# Patient Record
Sex: Female | Born: 1970 | Race: Black or African American | Hispanic: No | Marital: Single | Smoking: Current every day smoker
Health system: Southern US, Community
[De-identification: ages and names within clinical notes are randomized; demographics above are authoritative.]

## PROBLEM LIST (undated history)

## (undated) DIAGNOSIS — F329 Major depressive disorder, single episode, unspecified: Secondary | ICD-10-CM

## (undated) DIAGNOSIS — E119 Type 2 diabetes mellitus without complications: Secondary | ICD-10-CM

## (undated) DIAGNOSIS — F209 Schizophrenia, unspecified: Secondary | ICD-10-CM

## (undated) DIAGNOSIS — F32A Depression, unspecified: Secondary | ICD-10-CM

## (undated) DIAGNOSIS — F1910 Other psychoactive substance abuse, uncomplicated: Secondary | ICD-10-CM

## (undated) HISTORY — DX: Major depressive disorder, single episode, unspecified: F32.9

## (undated) HISTORY — PX: TUBAL LIGATION: SHX77

## (undated) HISTORY — DX: Depression, unspecified: F32.A

## (undated) HISTORY — DX: Other psychoactive substance abuse, uncomplicated: F19.10

## (undated) HISTORY — PX: OVARIAN CYST REMOVAL: SHX89

## (undated) HISTORY — PX: APPENDECTOMY: SHX54

---

## 2001-06-20 ENCOUNTER — Emergency Department (HOSPITAL_COMMUNITY): Admission: EM | Admit: 2001-06-20 | Discharge: 2001-06-20 | Payer: Self-pay | Admitting: Emergency Medicine

## 2001-10-01 ENCOUNTER — Emergency Department (HOSPITAL_COMMUNITY): Admission: EM | Admit: 2001-10-01 | Discharge: 2001-10-01 | Payer: Self-pay | Admitting: *Deleted

## 2001-10-01 ENCOUNTER — Encounter: Payer: Self-pay | Admitting: *Deleted

## 2004-10-02 ENCOUNTER — Emergency Department (HOSPITAL_COMMUNITY): Admission: EM | Admit: 2004-10-02 | Discharge: 2004-10-02 | Payer: Self-pay | Admitting: Emergency Medicine

## 2005-01-13 ENCOUNTER — Emergency Department (HOSPITAL_COMMUNITY): Admission: EM | Admit: 2005-01-13 | Discharge: 2005-01-13 | Payer: Self-pay | Admitting: Emergency Medicine

## 2007-06-17 ENCOUNTER — Emergency Department (HOSPITAL_COMMUNITY): Admission: EM | Admit: 2007-06-17 | Discharge: 2007-06-17 | Payer: Self-pay | Admitting: Emergency Medicine

## 2007-10-24 ENCOUNTER — Inpatient Hospital Stay (HOSPITAL_COMMUNITY): Admission: EM | Admit: 2007-10-24 | Discharge: 2007-10-27 | Payer: Self-pay | Admitting: Emergency Medicine

## 2007-10-24 ENCOUNTER — Encounter (INDEPENDENT_AMBULATORY_CARE_PROVIDER_SITE_OTHER): Payer: Self-pay | Admitting: General Surgery

## 2008-09-01 ENCOUNTER — Emergency Department (HOSPITAL_COMMUNITY): Admission: EM | Admit: 2008-09-01 | Discharge: 2008-09-01 | Payer: Self-pay | Admitting: Emergency Medicine

## 2008-09-25 ENCOUNTER — Emergency Department (HOSPITAL_COMMUNITY): Admission: EM | Admit: 2008-09-25 | Discharge: 2008-09-25 | Payer: Self-pay | Admitting: Emergency Medicine

## 2009-01-22 ENCOUNTER — Emergency Department (HOSPITAL_COMMUNITY): Admission: EM | Admit: 2009-01-22 | Discharge: 2009-01-22 | Payer: Self-pay | Admitting: Emergency Medicine

## 2009-01-24 ENCOUNTER — Emergency Department (HOSPITAL_COMMUNITY): Admission: EM | Admit: 2009-01-24 | Discharge: 2009-01-24 | Payer: Self-pay | Admitting: Emergency Medicine

## 2009-01-25 ENCOUNTER — Emergency Department (HOSPITAL_COMMUNITY): Admission: EM | Admit: 2009-01-25 | Discharge: 2009-01-25 | Payer: Self-pay | Admitting: Emergency Medicine

## 2009-04-15 ENCOUNTER — Inpatient Hospital Stay (HOSPITAL_COMMUNITY)
Admission: EM | Admit: 2009-04-15 | Discharge: 2009-04-17 | Payer: Self-pay | Source: Home / Self Care | Admitting: Emergency Medicine

## 2009-05-05 DIAGNOSIS — F1010 Alcohol abuse, uncomplicated: Secondary | ICD-10-CM | POA: Insufficient documentation

## 2009-05-05 DIAGNOSIS — F1910 Other psychoactive substance abuse, uncomplicated: Secondary | ICD-10-CM

## 2009-05-05 DIAGNOSIS — F329 Major depressive disorder, single episode, unspecified: Secondary | ICD-10-CM

## 2009-05-13 ENCOUNTER — Ambulatory Visit: Payer: Self-pay | Admitting: Cardiology

## 2009-05-13 DIAGNOSIS — I495 Sick sinus syndrome: Secondary | ICD-10-CM | POA: Insufficient documentation

## 2009-05-13 DIAGNOSIS — R079 Chest pain, unspecified: Secondary | ICD-10-CM

## 2009-05-13 DIAGNOSIS — R0602 Shortness of breath: Secondary | ICD-10-CM

## 2009-05-14 ENCOUNTER — Encounter: Payer: Self-pay | Admitting: Cardiology

## 2009-05-26 ENCOUNTER — Ambulatory Visit (HOSPITAL_COMMUNITY): Admission: RE | Admit: 2009-05-26 | Discharge: 2009-05-26 | Payer: Self-pay | Admitting: Cardiology

## 2009-05-26 ENCOUNTER — Ambulatory Visit: Payer: Self-pay | Admitting: Cardiology

## 2009-05-26 ENCOUNTER — Encounter: Payer: Self-pay | Admitting: Cardiology

## 2009-05-27 ENCOUNTER — Encounter: Payer: Self-pay | Admitting: Cardiology

## 2009-06-12 ENCOUNTER — Encounter (INDEPENDENT_AMBULATORY_CARE_PROVIDER_SITE_OTHER): Payer: Self-pay | Admitting: *Deleted

## 2009-07-24 ENCOUNTER — Encounter (INDEPENDENT_AMBULATORY_CARE_PROVIDER_SITE_OTHER): Payer: Self-pay | Admitting: *Deleted

## 2009-09-30 ENCOUNTER — Other Ambulatory Visit: Payer: Self-pay | Admitting: Emergency Medicine

## 2009-10-01 ENCOUNTER — Ambulatory Visit: Payer: Self-pay | Admitting: Psychiatry

## 2009-10-01 ENCOUNTER — Inpatient Hospital Stay (HOSPITAL_COMMUNITY): Admission: RE | Admit: 2009-10-01 | Discharge: 2009-10-03 | Payer: Self-pay | Admitting: Psychiatry

## 2010-01-02 ENCOUNTER — Encounter
Admission: RE | Admit: 2010-01-02 | Discharge: 2010-01-02 | Payer: Self-pay | Source: Home / Self Care | Attending: Internal Medicine | Admitting: Internal Medicine

## 2010-01-21 IMAGING — CR DG KNEE COMPLETE 4+V*R*
4 series · 4 of 4 positions shown · non-contrast
Comparison: None

CLINICAL DATA: Fell - right knee pain

RIGHT KNEE - COMPLETE 4+ VIEW

[view not recorded (1 of 4)]
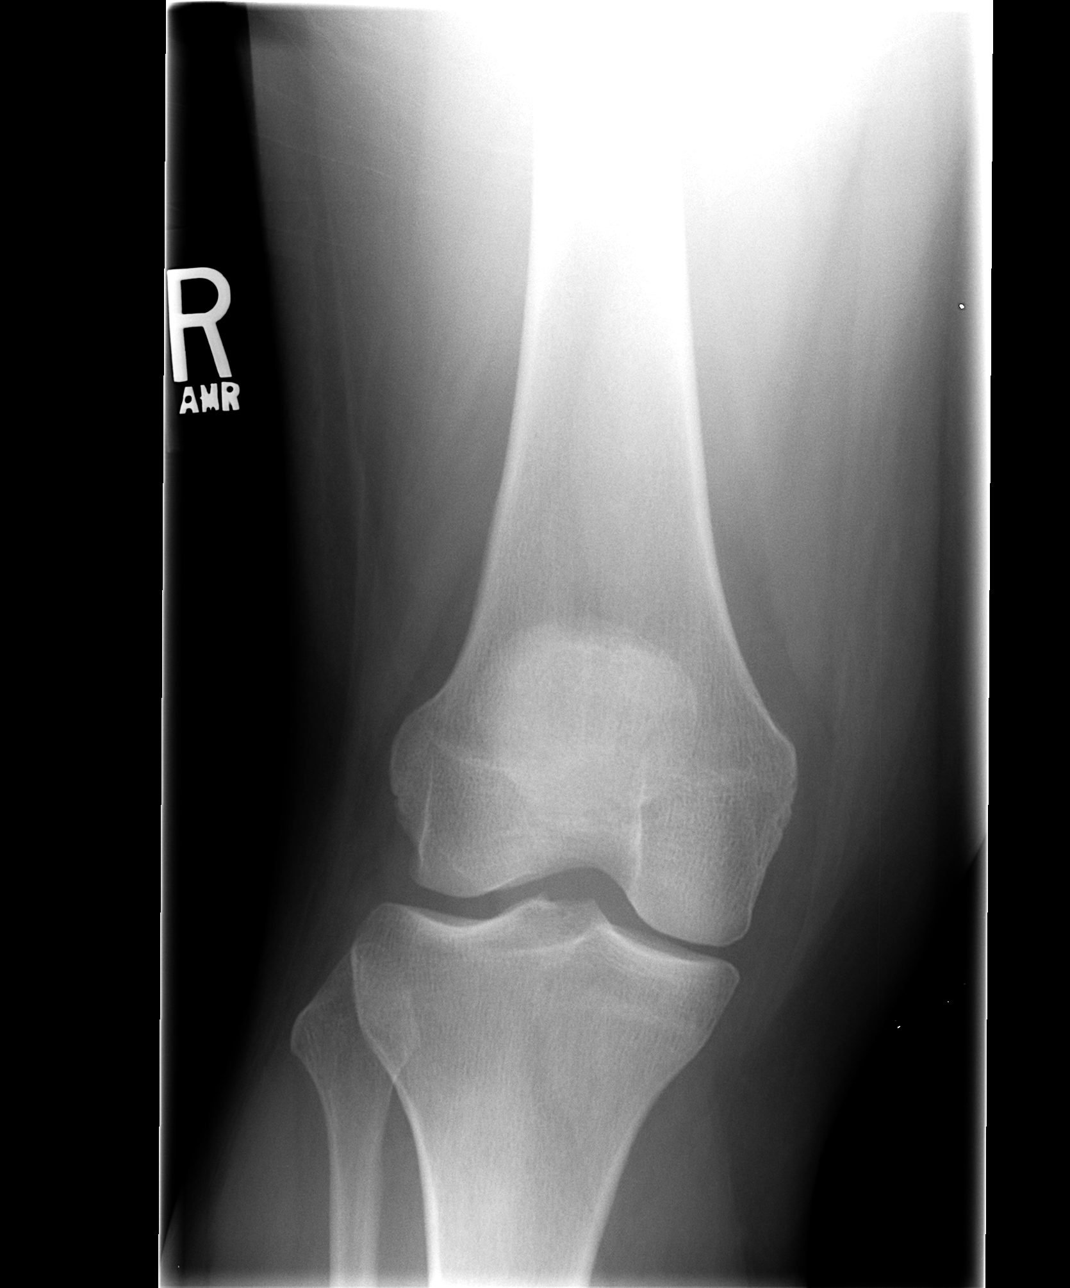

[view not recorded (2 of 4)]
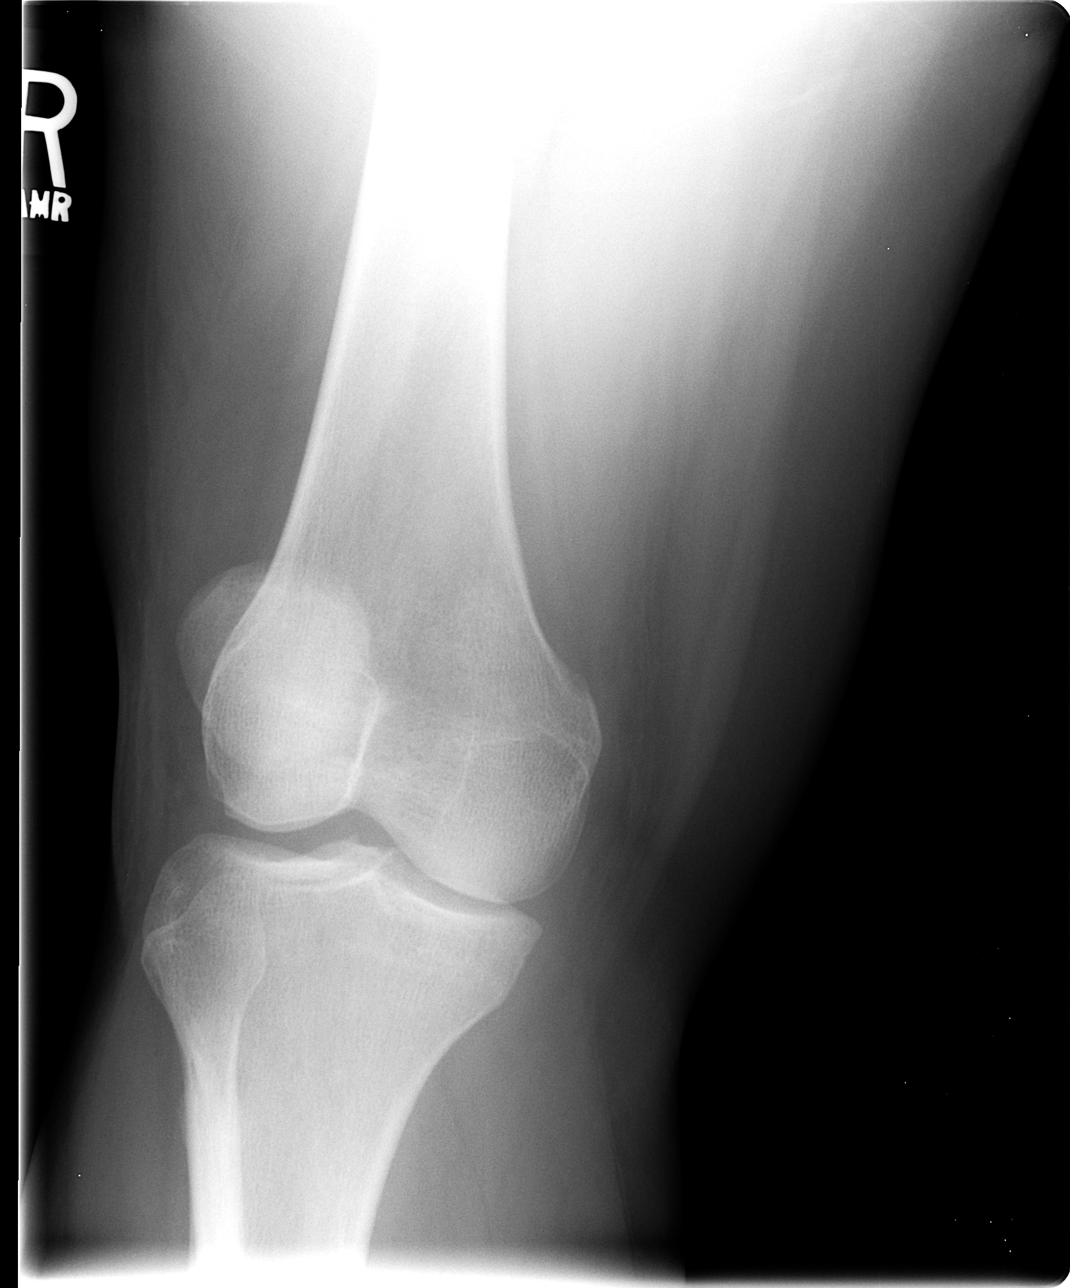

[view not recorded (3 of 4)]
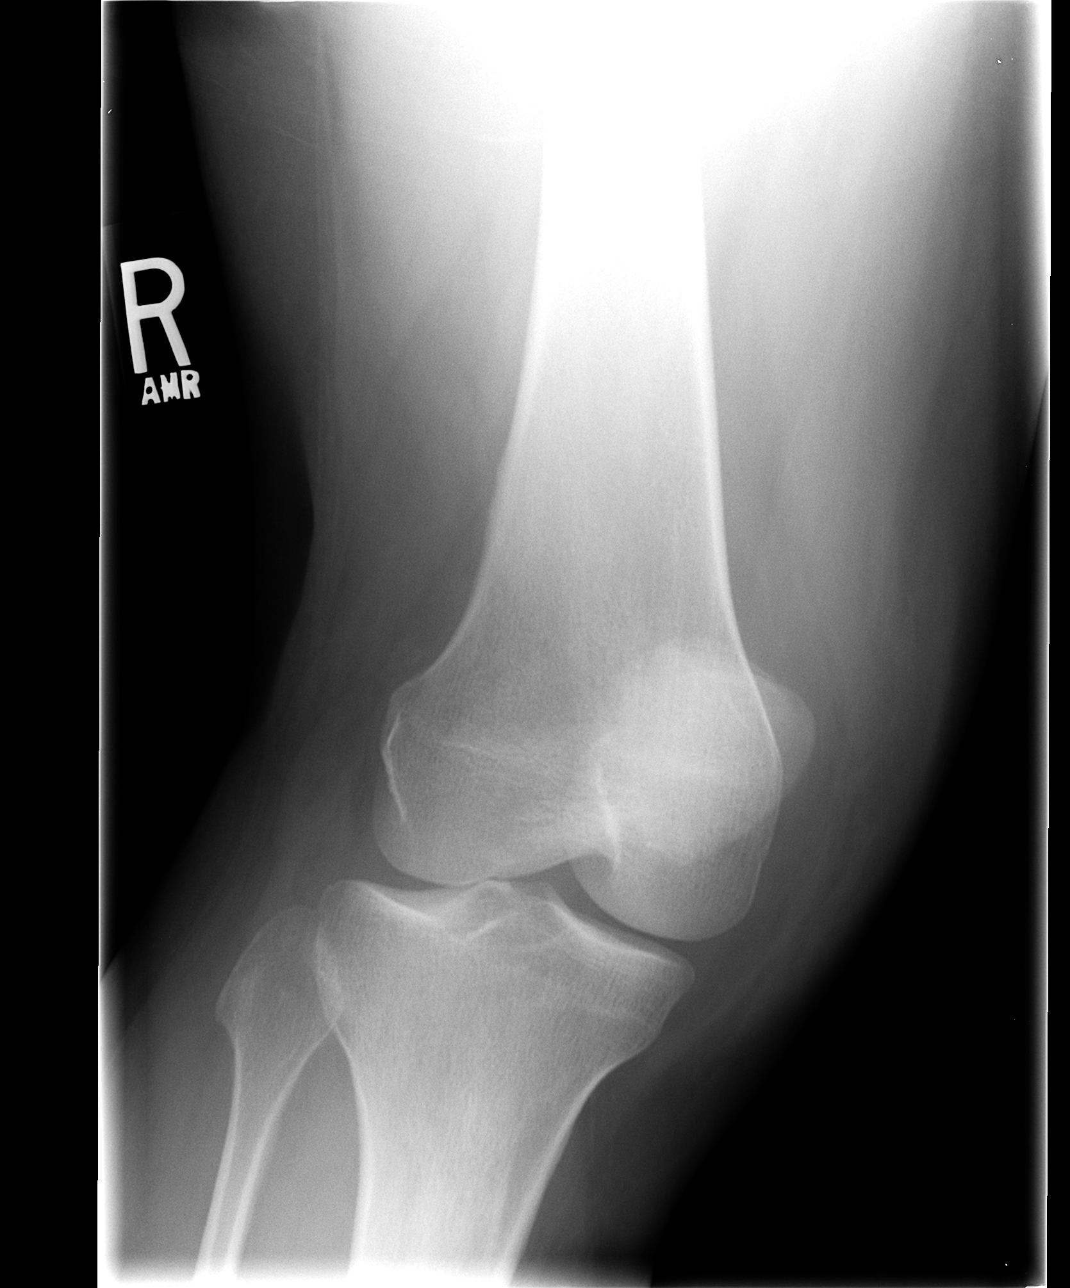

[view not recorded (4 of 4)]
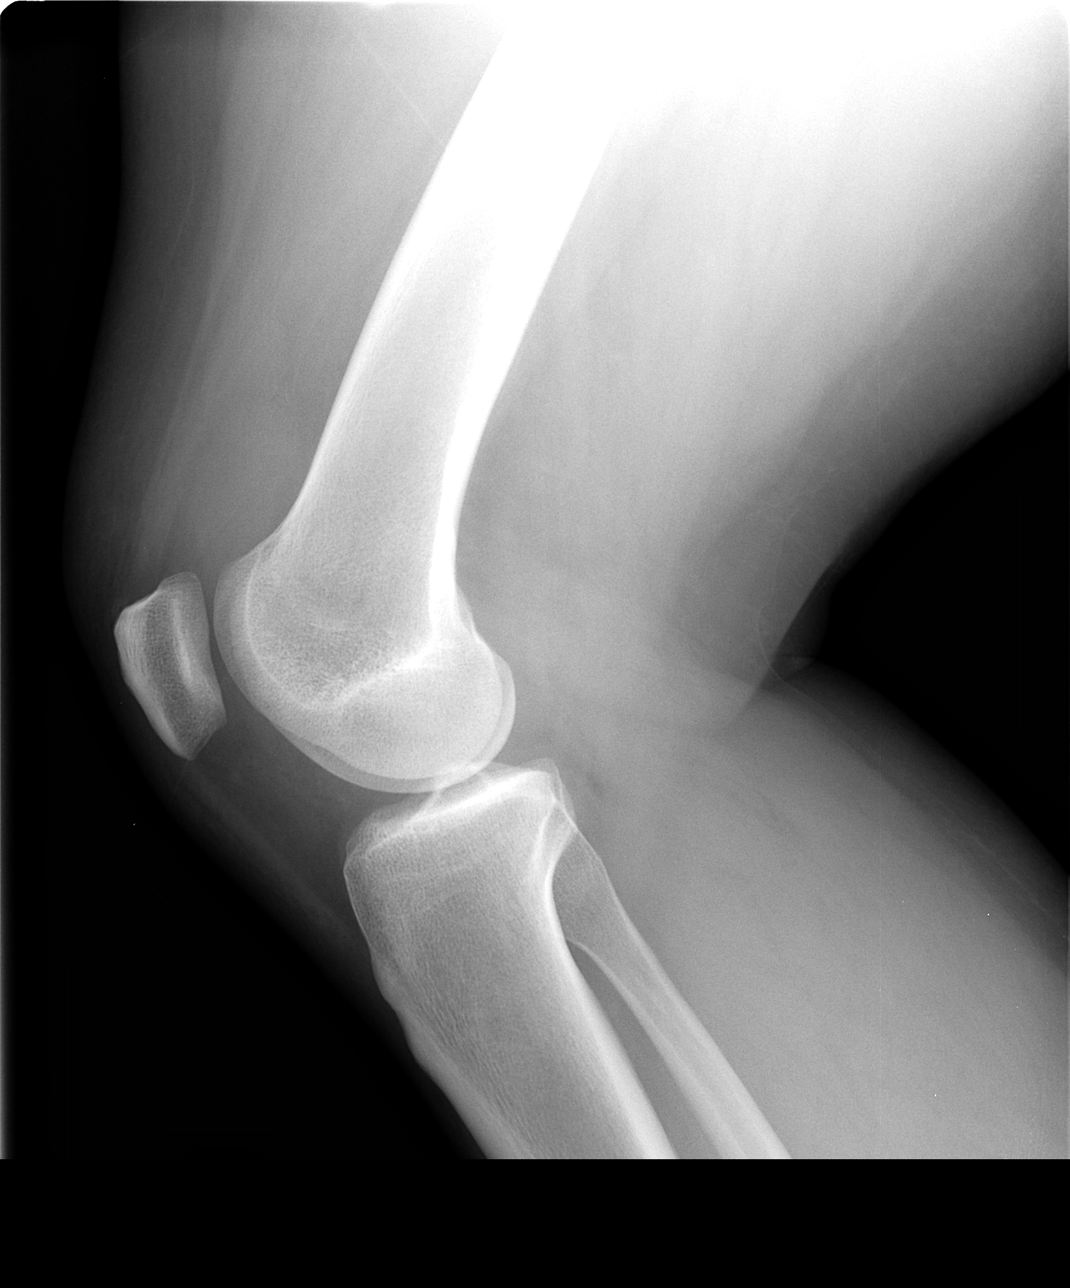

[4 of 4 positions shown; findings below may reference images not displayed]

FINDINGS: No fracture or dislocation.  Cannot rule out small joint
effusion.
IMPRESSION: Possible joint effusion but otherwise no acute findings.

## 2010-02-17 NOTE — Letter (Signed)
Summary:  Results Engineer, agricultural at [facility]  [address] [place] [address]   Phone: [phone]  Fax: [phone]      May 27, 2009 MRN: [id number]   [facility] [address] [place], [place]  [address]   Dear Ms. Freiberger,  Your test ordered by Selena Batten has been reviewed by your physician (or physician assistant) and was found to be normal or stable. Your physician (or physician assistant) felt no changes were needed at this time.  __X__ Echocardiogram  ____ Cardiac Stress Test  ____ Lab Work  ____ Peripheral vascular study of arms, legs or neck  ____ CT scan or X-ray  ____ Lung or Breathing test  ____ Other: Please continue on current medical treatment.   Thank you.   Nona Dell, MD, F.A.C.C

## 2010-02-17 NOTE — Assessment & Plan Note (Signed)
Summary: ***np6 bradycardia dyspnia   Visit Type:  Initial Consult Primary Provider:  [facility]   History of Present Illness: 40 year old woman referred for cardiology consultation. She is referred with documentation of recent sinus bradycardia and also symptoms of shortness of breath as well as chest discomfort.  She was recently admitted to [facility] in the setting of polysubstance abuse including acute alcohol intoxication and urine drug screen positive for cocaine. Based on the discharge summary, her behavior was quite erratic and disruptive. She apparently complained of some mild dyspnea during that hospitalization. Vital sign review finds hemodynamic stability including heart rates documented in the 60s to 80s. TSH was normal at 1.3. Chest x-ray is reviewed below.  She indicates at least a one-year history of dyspnea on exertion, NYHA class 2-3, and also intermittent chest discomfort as well as "tingling" the left arm and left leg with activity. She sometimes feels like her left shoulder "swells" when she walks. She also indicates intermittent palpitations and what sounds like potential orthopnea at times. She has only mild lower extremity edema intermittently. She denies any syncope.  I discussed with her quite frankly her history of polysubstance abuse, and she admitted to all of these today. She states that she is beginning a process of trying to quit. She states that her oldest daughter will be graduating from high school this summer, and has apparently explained to her mother that she does not "want her around" unless she is able to "get herself straight."  Current Medications (verified): 1)  None  Allergies (verified): No Known Drug Allergies  Past History:  Past Medical History: Last updated: 05/05/2009 Depression Polysubstance abuse resulting in hospitalization  Past Surgical History: Last updated: 05/05/2009 Appendectomy Tubal  ligation Ovarian cystectomy  Family History: Last updated: 05/05/2009 Noncontributory based on little information  Social History: Last updated: 05/05/2009 Single  Drug Use - yes (cocaine) Alcohol Use - yes  Clinical Review Panels:  CXR CXR results  Clinical Data: Overdose    PORTABLE CHEST - 1 VIEW    Comparison: 09/01/2008    Findings: Artifact overlies the chest.  There is abnormal density   in both lower lobes consistent with atelectasis and pneumonia,   possibly due to aspiration.  The upper lobes are clear.  No   effusions.    IMPRESSION:   Abnormal density in both lower lobes consistent with atelectasis   and pneumonia, possibly due to aspiration.    Read By:  Thomasenia Sales,  M.D.   Released By:  Thomasenia Sales,  M.D.  Additional Information  HL7 RESULT STATUS : F  External image : [id number]  External IF Update Timestamp : 2009-04-15:16:31:33.000000 (04/15/2009)    Review of Systems       The patient complains of chest pain and dyspnea on exertion.  The patient denies weight loss, syncope, prolonged cough, hemoptysis, abdominal pain, melena, and hematochezia.         Otherwise reviewed and negative.  Vital Signs:  Patient profile:   40 year old female Height:      61 inches Weight:      179 pounds BMI:     33.94 Pulse rate:   101 / minute BP sitting:   118 / 77  (right arm)  Vitals Entered By: Dreama Saa, CNA (May 13, 2009 3:44 PM)  Physical Exam  Additional Exam:  Somewhat disheveled young woman, overweight, in no acute distress. HEENT: Conjunctiva and lids normal, oropharynx with poor dentition.  Neck: Supple, no elevated JVP or bruits. Lungs: Clear with diminished, coarse breath sounds. Cardiac: Indistinct PMI, possible soft S3, no loud systolic murmur. Abdomen: Soft, nontender, bowel sounds present. Extremities: Trace ankle edema, distal pulses 1-2+. Skin: Warm and dry. Musculoskeletal: No kyphosis. Neuropsychiatric: Alert  and oriented x3.   EKG  Procedure date:  05/13/2009  Findings:      Sinus rhythm at 98 beats per minute, poor R-wave progression, probable left atrial enlargement.  Impression & Recommendations:  Problem # 1:  DYSPNEA (ICD-786.05)  Long-standing, at least over the last year, and noted in the setting of polysubstance abuse including at least cocaine and alcohol. Cardiomyopathy is certainly to be considered, as is cardiovascular disease. I discussed these issues with her today. We plan a 2-D echocardiogram to assess cardiac structure and function. This may help with further recommendations regarding potential medical therapy. I do not plan further ischemic testing or invasive evaluation at this point. Quite frankly, none of these interventions are likely to make much of a difference unless she is able to completely abstain from substance abuse. I discussed this with her very clearly. We also gave her some numbers to local rehabilitation centers. She otherwise plans to follow up with the health department. We will schedule a visit to review her study results.  Orders: 2-D Echocardiogram (2D Echo)  Problem # 2:  SUBSTANCE ABUSE, MULTIPLE (ICD-305.90)  Includes cocaine and alcohol, possibly others. She does admit to this today. I am hopeful that she will be able to make some positive lifestyle changes.  Problem # 3:  SINUS BRADYCARDIA (ICD-427.81)  I see this documented only once. It is not clear that this represents any clinically significant problem as yet.  Patient Instructions: 1)  Your physician recommends that you schedule a follow-up appointment in: 1 month 2)  Your physician recommends that you continue on your current medications as directed. Please refer to the Current Medication list given to you today. 3)  Your physician has requested that you have an echocardiogram.  Echocardiography is a painless test that uses sound waves to create images of your heart. It provides your doctor  with information about the size and shape of your heart and how well your heart's chambers and valves are working.  This procedure takes approximately one hour. There are no restrictions for this procedure.

## 2010-02-17 NOTE — Letter (Signed)
Summary: Appointment - Missed  [facility]  [address] [place] [address]   Phone: [phone]  Fax: [phone]     July 24, 2009 MRN: [id number]   [facility] [address] [place], [place]  [address]   Dear Wendy Larsen,  Our records indicate you missed your appointment on        07/24/09                with Dr.       .         MCDOWELL                              It is very important that we reach you to reschedule this appointment. We look forward to participating in your health care needs. Please contact us at the number listed above at your earliest convenience to reschedule this appointment.     Sincerely,    Neurosurgeon Team  Appended Document: Appointment - Missed letter was sent back to [place] 08/08/09/tmj

## 2010-02-17 NOTE — Letter (Signed)
Summary: EKG  EKG   Imported By: Faythe Ghee 05/14/2009 10:25:49  _____________________________________________________________________  External Attachment:    Type:   Image     Comment:   External Document

## 2010-02-17 NOTE — Letter (Signed)
Summary: PROGRESS NOTE PUBLIC HEALTH  PROGRESS NOTE PUBLIC HEALTH   Imported By: Faythe Ghee 05/14/2009 10:25:31  _____________________________________________________________________  External Attachment:    Type:   Image     Comment:   External Document

## 2010-02-17 NOTE — Letter (Signed)
Summary: Appointment - Missed  [facility]  [address] [place] [address]   Phone: [phone]  Fax: [phone]     Jun 12, 2009 MRN: [id number]   [facility] [address] [place], [place]  [address]   Dear Ms. Mairena,  Our records indicate you missed your appointment on        06/12/09                with Dr.       .    MCDOWELL                                It is very important that we reach you to reschedule this appointment. We look forward to participating in your health care needs. Please contact us at the number listed above at your earliest convenience to reschedule this appointment.     Sincerely,    Glass blower/designer

## 2010-03-02 ENCOUNTER — Telehealth (INDEPENDENT_AMBULATORY_CARE_PROVIDER_SITE_OTHER): Payer: Self-pay | Admitting: *Deleted

## 2010-03-11 ENCOUNTER — Telehealth (INDEPENDENT_AMBULATORY_CARE_PROVIDER_SITE_OTHER): Payer: Self-pay | Admitting: *Deleted

## 2010-03-11 NOTE — Progress Notes (Signed)
  Request received from [facility] sent to [facility] for completion [facility]  March 02, 2010 3:58 PM

## 2010-03-17 NOTE — Progress Notes (Signed)
  Phone Note Other Incoming   Request: Send information Summary of Call: Request received from [facility] sent to [employer].

## 2010-04-02 LAB — RAPID URINE DRUG SCREEN, HOSP PERFORMED
Barbiturates: NOT DETECTED
Cocaine: NOT DETECTED
Opiates: NOT DETECTED

## 2010-04-02 LAB — POCT I-STAT, CHEM 8
BUN: 6 mg/dL (ref 6–23)
Chloride: 105 mEq/L (ref 96–112)
HCT: 53 % — ABNORMAL HIGH (ref 36.0–46.0)
Hemoglobin: 18 g/dL — ABNORMAL HIGH (ref 12.0–15.0)
Potassium: 4.3 mEq/L (ref 3.5–5.1)
TCO2: 26 mmol/L (ref 0–100)

## 2010-04-05 LAB — WOUND CULTURE: Culture: NO GROWTH

## 2010-04-12 LAB — RAPID URINE DRUG SCREEN, HOSP PERFORMED
Amphetamines: NOT DETECTED
Barbiturates: NOT DETECTED
Cocaine: POSITIVE — AB
Opiates: NOT DETECTED

## 2010-04-12 LAB — COMPREHENSIVE METABOLIC PANEL
AST: 20 U/L (ref 0–37)
AST: 16 U/L (ref 0–37)
Albumin: 3.3 g/dL — ABNORMAL LOW (ref 3.5–5.2)
Alkaline Phosphatase: 44 U/L (ref 39–117)
BUN: 5 mg/dL — ABNORMAL LOW (ref 6–23)
BUN: 7 mg/dL (ref 6–23)
CO2: 19 mEq/L (ref 19–32)
CO2: 23 mEq/L (ref 19–32)
Calcium: 9.4 mg/dL (ref 8.4–10.5)
Chloride: 110 mEq/L (ref 96–112)
Chloride: 116 mEq/L — ABNORMAL HIGH (ref 96–112)
Creatinine, Ser: 0.68 mg/dL (ref 0.4–1.2)
Creatinine, Ser: 0.84 mg/dL (ref 0.4–1.2)
Creatinine, Ser: 0.88 mg/dL (ref 0.4–1.2)
GFR calc Af Amer: >60 mL/min (ref >60)
GFR calc Af Amer: >60 mL/min (ref >60)
GFR calc non Af Amer: >60 mL/min (ref >60)
GFR calc non Af Amer: >60 mL/min (ref >60)
Glucose, Bld: 111 mg/dL — ABNORMAL HIGH (ref 70–99)
Glucose, Bld: 80 mg/dL (ref 70–99)
Potassium: 3.9 mEq/L (ref 3.5–5.1)
Potassium: 4.2 mEq/L (ref 3.5–5.1)
Total Bilirubin: 0.7 mg/dL (ref 0.3–1.2)
Total Bilirubin: 0.7 mg/dL (ref 0.3–1.2)
Total Protein: 6.3 g/dL (ref 6.0–8.3)

## 2010-04-12 LAB — URINE CULTURE: Colony Count: NO GROWTH

## 2010-04-12 LAB — URINALYSIS, ROUTINE W REFLEX MICROSCOPIC
Bilirubin Urine: NEGATIVE
Glucose, UA: NEGATIVE mg/dL
Nitrite: NEGATIVE
Specific Gravity, Urine: 1.015 (ref 1.005–1.030)
pH: 6 (ref 5.0–8.0)

## 2010-04-12 LAB — BASIC METABOLIC PANEL
BUN: 5 mg/dL — ABNORMAL LOW (ref 6–23)
Calcium: 8.9 mg/dL (ref 8.4–10.5)
GFR calc non Af Amer: >60 mL/min (ref >60)
Potassium: 4.1 mEq/L (ref 3.5–5.1)

## 2010-04-12 LAB — BLOOD GAS, ARTERIAL
Acid-base deficit: 3 mmol/L — ABNORMAL HIGH (ref 0.0–2.0)
Bicarbonate: 21 mEq/L (ref 20.0–24.0)
O2 Saturation: 93 %
TCO2: 18.8 mmol/L (ref 0–100)
pH, Arterial: 7.381 (ref 7.350–7.400)

## 2010-04-12 LAB — CBC
HCT: 35.5 % — ABNORMAL LOW (ref 36.0–46.0)
HCT: 37.9 % (ref 36.0–46.0)
Hemoglobin: 12.4 g/dL (ref 12.0–15.0)
MCHC: 35.4 g/dL (ref 30.0–36.0)
MCV: 92.8 fL (ref 78.0–100.0)
MCV: 95.7 fL (ref 78.0–100.0)
MCV: 95.5 fL (ref 78.0–100.0)
Platelets: 199 10*3/uL (ref 150–400)
Platelets: 221 10*3/uL (ref 150–400)
RBC: 3.72 MIL/uL — ABNORMAL LOW (ref 3.87–5.11)
RBC: 4.41 MIL/uL (ref 3.87–5.11)
RDW: 14.9 % (ref 11.5–15.5)
WBC: 12.4 10*3/uL — ABNORMAL HIGH (ref 4.0–10.5)
WBC: 12.2 10*3/uL — ABNORMAL HIGH (ref 4.0–10.5)

## 2010-04-12 LAB — ACETAMINOPHEN LEVEL: Acetaminophen (Tylenol), Serum: <10 ug/mL — ABNORMAL LOW (ref 10–30)

## 2010-04-12 LAB — DIFFERENTIAL
Basophils Absolute: 0 10*3/uL (ref 0.0–0.1)
Basophils Relative: 0 % (ref 0–1)
Eosinophils Relative: 3 % (ref 0–5)
Lymphocytes Relative: 31 % (ref 12–46)
Lymphocytes Relative: 21 % (ref 12–46)
Lymphocytes Relative: 26 % (ref 12–46)
Lymphs Abs: 3.1 10*3/uL (ref 0.7–4.0)
Monocytes Absolute: 0.9 10*3/uL (ref 0.1–1.0)
Monocytes Relative: 7 % (ref 3–12)
Neutro Abs: 7.7 10*3/uL (ref 1.7–7.7)
Neutro Abs: 8.6 10*3/uL — ABNORMAL HIGH (ref 1.7–7.7)
Neutrophils Relative %: 60 % (ref 43–77)

## 2010-04-12 LAB — T4, FREE: Free T4: 1.19 ng/dL (ref 0.80–1.80)

## 2010-04-12 LAB — TSH: TSH: 1.376 u[IU]/mL (ref 0.350–4.500)

## 2010-04-12 LAB — SALICYLATE LEVEL: Salicylate Lvl: <4 mg/dL (ref 2.8–20.0)

## 2010-04-12 LAB — PREGNANCY, URINE: Preg Test, Ur: NEGATIVE

## 2010-04-12 LAB — MAGNESIUM: Magnesium: 2.1 mg/dL (ref 1.5–2.5)

## 2010-06-02 NOTE — Discharge Summary (Signed)
NAMESHANEA, Wendy Larsen                 ACCOUNT NO.:  [id number]   MEDICAL RECORD NO.:  [id number]          PATIENT TYPE:  INP   LOCATION:  A323                          FACILITY:  [facility]   PHYSICIAN:  Barbaraann Barthel, M.D. DATE OF BIRTH:  04/15/1970   DATE OF ADMISSION:  10/24/2007  DATE OF DISCHARGE:  10/09/2009LH                               DISCHARGE SUMMARY   DIAGNOSES:  1. Acute appendicitis.  2. Ruptured right ovarian cyst.   This is a 40 year old black female who came to the emergency room with  right lower quadrant pain with nausea and vomiting and an elevated white  count.  She was seen in the emergency room and evaluated where a CT scan  revealed changes suggestive of acute appendicitis.  She also had some  fluid in her pelvis.  We took her to surgery where an open appendectomy  was performed.  She had an obvious ruptured ovarian cyst with some  serosanguineous fluid in her pelvis as well as an acute supportive  nonperforated appendix.  Appendectomy was performed uneventfully.  The  drain was placed and she was discharged on the third postoperative day  at which time she was tolerating p.o. well.  Her wound was clean.  Her  Jackson-Pratt drain was serous in nature and she remained afebrile  without shortness of breath, leg pain, or any other signs of wound  infection.   LABORATORY DATA:  CT scan revealed changes suggestive of acute  appendicitis as mentioned above.  Her laboratory work, she was admitted  with an 18,000 white count on the following day, this was down to  16,000.  As stated, she remained clinically stable with an afebrile.  Her pathology report revealed acute supportive appendicitis.   DISCHARGE INSTRUCTIONS:  The patient is discharged on Cipro 500 mg p.o.  b.i.d.  We will give her a dose a couple of hours early prior to her  discharge, as she may have some trouble obtaining her Cipro as an  outpatient.  She is instructed to take her Cipro around 10 or 11  o'clock  tonight and 10 or 11 o'clock in the morning every 12 hours.  She is told  to continue to take her Tegretol, which she takes for her manic  depression.  She is told to avoid taking any aspirin products.   Her diet, she is told to take clear liquids and soft diet as tolerated.  She is told to take no harsh cathartics or laxatives.  She is told to  clean her wound with alcohol 2 or 3 times a day and she is told to  contact us should there be any acute changes.  We also told her to  refrain from taking any aspirin products.  We will follow up with her  and remove her sutures on the seventh postoperative day.  We discussed  followup care with her and all questions were answered.      Barbaraann Barthel, M.D.  Electronically Signed     WB/MEDQ  D:  10/27/2007  T:  10/27/2007  Job:  [id number]   cc:  Rhae Lerner. Margretta Ditty, M.D.  [address]  [place]  [place] [address]

## 2010-06-02 NOTE — Consult Note (Signed)
Wendy Larsen, Wendy Larsen                 ACCOUNT NO.:  [id number]   MEDICAL RECORD NO.:  [id number]          PATIENT TYPE:  INP   LOCATION:  A323                          FACILITY:  [facility]   PHYSICIAN:  Barbaraann Barthel, M.D. DATE OF BIRTH:  1970/11/02   DATE OF CONSULTATION:  10/24/2007  DATE OF DISCHARGE:                                 CONSULTATION   Note, Surgery was asked to see this 40 year old black female for  abdominal pain.   CHIEF COMPLAINT:  Abdominal pain with nausea and vomiting.   HISTORY OF PRESENT MEDICAL ILLNESS:  The patient states that she was in  her normal state of good health until around midnight last night when  she developed some right lower quadrant pain.  Her lower abdominal pain  that continued during the night was accompanied with nausea and  vomiting.  This did not get better when she took some antacids and then  later she came to the emergency room where she was examined.  The CT  scan revealed the presence of acute appendicitis.  I discussed this with  Dr. Allyson Sabal, the radiologist as well.   PHYSICAL EXAMINATION:  GENERAL:  She is uncomfortable, but in no acute  distress.  She is 5 feet 1 inch, and weighs at least 140 pounds.  VITAL SIGNS:  Blood pressure is 120/68, respirations are 24, pulse rate  73 and regular, and temperature upon admission is 97.9.  HEENT:  Head is normocephalic.  Eyes, extraocular movements are intact.  Pupils are round and react to light and accommodation.  There is no  conjunctival pallor or scleral injection.  The sclerae is in normal  tincture.  Nose and oral mucosa are moist.  The patient has very poor  dentition.  Neck is supple and cylindrical without jugular vein  distention, thyromegaly, tracheal deviation, cervical adenopathy, or  bruits auscultated.  CHEST:  Clear, both anterior and posterior auscultation.  HEART:  Regular rhythm.  BREASTS AND AXILLA:  Without masses.  ABDOMEN:  Bowel sounds are diminished.  The patient  has tenderness and  guarding in the right lower quadrant.  There are no femoral or inguinal  hernias appreciated.  RECTAL:  Guaiac-negative stool.  EXTREMITIES:  Within normal limits.   REVIEW OF SYSTEMS:  GI:  Nausea and vomiting and she says she has been  having some loose stools over the last couple of days, but no bright red  rectal bleeding, or unexplained weight loss, or any history of  inflammatory bowel disease, or irritable bowel syndrome.  She has had no  previous colonoscopy.  No history of hepatitis.  GU:  No history of  nephrolithiasis or dysuria.  CARDIORESPIRATORY:  She does smoke and she  has been trying to quit.  She is now down to approximately 3 cigarettes  per day.  ENDOCRINE:  No history of diabetes or thyroid disease.  NEUROLOGIC:  The patient has a history of anxiety and manic depression  for which she takes some psychiatric meds which she did not bring to the  emergency room.  MUSCULOSKELETAL:  The patient states that  she is 140  pounds, but I suspect she is probably 160.  OB/GYN:  She had a negative  serum hCG.  She is a gravida 3, para 2, abortus 1, and cesarean 0  female.  She had a tubal ligation in 1996 and she has a history of a  paternal aunt with history of breast cancer.   PAST SURGERY:  Tubal ligation in 1996, that is all the surgery that she  has had.   DRUG ALLERGIES:  She states that she has an intolerance to ASPIRIN that  gives her nausea.  She has no history of rash or respiratory problem  when taking this.   MEDICATIONS:  As stated, she is on some psychiatric meds which she did  not bring.   LABORATORY DATA:  The patient has a white count of 18.8 with an H and H  of 13.7 and 39.9, and platelet count 251,000.  She has a left shift with  87% neutrophils.  Metabolic 7 is grossly within normal limits and as  stated first, urine hCG is negative.  Electrolytes grossly within normal  limits.  Her BUN is 7 with a creatinine of 0.67.  Liver function  studies  were done and they are within normal limits.   IMPRESSION:  Acute appendicitis.   PLAN:  Continue to keep her n.p.o.  She has not eaten since the wee  hours in the morning.  She said it is approximately 3:00 a.m. this  morning she has not eaten.  We will continue her n.p.o. status.  We will  begin antibiotics and hydration and plan for an appendectomy.   We discussed the surgery in detail with the patient.  We discussed  complications not limited to, but including bleeding, infection, and  appendiceal stump leak.  Informed consent was obtained.  All questions  were answered.      Barbaraann Barthel, M.D.  Electronically Signed     WB/MEDQ  D:  10/24/2007  T:  10/25/2007  Job:  [id number]   cc:   Rhae Lerner. Margretta Ditty, M.D.  [address]  [place]  [place] [address]   Barbaraann Barthel, M.D.  Fax: [phone]

## 2010-06-02 NOTE — Op Note (Signed)
Wendy Larsen, Wendy Larsen                 ACCOUNT NO.:  [id number]   MEDICAL RECORD NO.:  [id number]          PATIENT TYPE:  INP   LOCATION:  A323                          FACILITY:  [facility]   PHYSICIAN:  Barbaraann Barthel, M.D. DATE OF BIRTH:  [birth year]   DATE OF PROCEDURE:  10/24/2007  DATE OF DISCHARGE:                               OPERATIVE REPORT   POSTOPERATIVE DIAGNOSIS:  Acute appendicitis.   PROCEDURE:  Open appendectomy.   SPECIMEN:  Appendix.   WOUND CLASSIFICATION:  Contaminated.   HISTORY:  Note, this is a 40 year old black female who came into the  emergency room with less than 24-hour history of right lower quadrant  pain, nausea, and vomiting.  CT scan was done in the emergency room  which revealed acute appendicitis.  It was also appeared to be a  ruptured cyst, the Corpus luteum cyst on CT scan as well with little  fluid and no obvious free air.   We discussed appendectomy with the patient discussing complications not  limited to, but including bleeding, infection, and appendiceal stump  leak.  We also discussed possibility that drain might be placed pending  upon the findings.  Informed consent was obtained.   GROSS OPERATIVE FINDINGS:  Consistent with acute suppurative  nonperforated appendix, but rather necrotic towards its base.  Ruptured  Corpus luteum cyst on the right ovary.  There was fluid and blood in the  right lower iliac fossa.   Terminal ileum also appeared to be normal.  No other abnormalities  found.   TECHNIQUE:  The patient was placed in supine position.  After the  adequate administration of general anesthesia via endotracheal  intubation, her entire abdomen was prepped with Betadine solution and  draped in usual manner.  A Foley catheter was aseptically inserted and  then an incision was carried out on the right lower quadrant through  skin and subcutaneous tissue down to the rectus muscle which was  retracted medially.  This was not divided.   The posterior sheath and the  peritoneum was grasped and opened, and the abdomen was entered and  explored with the above finding.  I placed a wound liner in the wound  and then ligated the mesoappendix with 2-0 silk and amputated it with TA-  30 stapling device.  The stump was oversewn with 3-0 GI silk and I  elected to leave a drain in the right lower quadrant.  After checking  for hemostasis, we changed gloves, irrigated with normal saline  solution, then I closed the peritoneum with a running 0 Polysorb suture  and the fascia with interrupted figure-of-eight 0 Polysorb sutures.  A  0.5% Sensorcaine was used for postoperative comfort and we closed the  wound with a stapling device.  The drain was sutured in place with 3-0  nylon.  Prior to closure all sponge, needle, and instrument counts were  found to be correct.  Estimated blood loss was minimal.  The patient  received 1100 mL crystalloids intraoperatively.  One Jackson-Pratt drain was placed as mentioned and we did take cultures  near the appendix.  Prior to closure as stated, all sponge, needle, and  instrument counts were found be correct.  The patient was taken to  recovery room in satisfactory condition.      Barbaraann Barthel, M.D.  Electronically Signed     WB/MEDQ  D:  10/24/2007  T:  10/25/2007  Job:  [id number]   cc:   Rhae Lerner. Margretta Ditty, M.D.  [address]  [place]  [place] [address]

## 2010-10-14 LAB — URINE MICROSCOPIC-ADD ON

## 2010-10-14 LAB — URINE CULTURE

## 2010-10-14 LAB — URINALYSIS, ROUTINE W REFLEX MICROSCOPIC
Glucose, UA: NEGATIVE
Specific Gravity, Urine: 1.01
pH: 6

## 2010-10-19 LAB — BASIC METABOLIC PANEL
CO2: 22
GFR calc non Af Amer: >60
Glucose, Bld: 132 — ABNORMAL HIGH
Potassium: 3.8
Sodium: 139

## 2010-10-19 LAB — URINALYSIS, ROUTINE W REFLEX MICROSCOPIC
Ketones, ur: 15 — AB
Nitrite: NEGATIVE
Protein, ur: NEGATIVE

## 2010-10-19 LAB — DIFFERENTIAL
Basophils Absolute: 0
Basophils Absolute: 0.1
Basophils Relative: 0
Eosinophils Relative: 1
Lymphocytes Relative: 14
Monocytes Absolute: 1.1 — ABNORMAL HIGH
Monocytes Relative: 7
Neutro Abs: 16.4 — ABNORMAL HIGH
Neutrophils Relative %: 87 — ABNORMAL HIGH

## 2010-10-19 LAB — CBC
HCT: 38.1
Hemoglobin: 12.9
Hemoglobin: 13.7
MCV: 95.8
RBC: 3.93
RBC: 4.16
RDW: 13.5
WBC: 18.8 — ABNORMAL HIGH

## 2010-10-19 LAB — COMPREHENSIVE METABOLIC PANEL
Alkaline Phosphatase: 44
BUN: 7
Chloride: 105
Glucose, Bld: 108 — ABNORMAL HIGH
Potassium: 4
Total Bilirubin: 0.4

## 2010-10-19 LAB — PREGNANCY, URINE: Preg Test, Ur: NEGATIVE

## 2010-10-19 LAB — WOUND CULTURE: Culture: NO GROWTH

## 2011-01-13 ENCOUNTER — Encounter: Payer: Self-pay | Admitting: Cardiology

## 2011-10-30 ENCOUNTER — Emergency Department (HOSPITAL_COMMUNITY)
Admission: EM | Admit: 2011-10-30 | Discharge: 2011-10-31 | Discharge status: Against Medical Advice | Payer: Medicaid Other | Attending: Emergency Medicine | Admitting: Emergency Medicine

## 2011-10-30 DIAGNOSIS — Z0389 Encounter for observation for other suspected diseases and conditions ruled out: Secondary | ICD-10-CM | POA: Insufficient documentation

## 2011-10-30 NOTE — ED Notes (Signed)
Pt refuses to cooperate during triage and asks to leave.
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
## 2011-11-06 ENCOUNTER — Encounter (HOSPITAL_COMMUNITY): Payer: Self-pay

## 2011-11-06 ENCOUNTER — Emergency Department (HOSPITAL_COMMUNITY)
Admission: EM | Admit: 2011-11-06 | Discharge: 2011-11-06 | Disposition: A | Discharge status: Home / Self Care | Payer: Medicaid Other | Attending: Emergency Medicine | Admitting: Emergency Medicine

## 2011-11-06 ENCOUNTER — Emergency Department (HOSPITAL_COMMUNITY): Payer: Medicaid Other

## 2011-11-06 DIAGNOSIS — F3289 Other specified depressive episodes: Secondary | ICD-10-CM | POA: Insufficient documentation

## 2011-11-06 DIAGNOSIS — F1910 Other psychoactive substance abuse, uncomplicated: Secondary | ICD-10-CM | POA: Insufficient documentation

## 2011-11-06 DIAGNOSIS — F329 Major depressive disorder, single episode, unspecified: Secondary | ICD-10-CM | POA: Insufficient documentation

## 2011-11-06 DIAGNOSIS — M25559 Pain in unspecified hip: Secondary | ICD-10-CM

## 2011-11-06 DIAGNOSIS — Z9181 History of falling: Secondary | ICD-10-CM | POA: Insufficient documentation

## 2011-11-06 MED ORDER — HYDROCODONE-ACETAMINOPHEN 5-325 MG PO TABS
1.0000 | ORAL_TABLET | Freq: Once | ORAL | Status: AC
  Administered 2011-11-06: 1 via ORAL
  Filled 2011-11-06: qty 1
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
## 2011-11-06 NOTE — ED Notes (Signed)
C/o rt hip post fall 1 week ago.

## 2011-11-18 ENCOUNTER — Encounter (HOSPITAL_COMMUNITY): Payer: Self-pay | Admitting: *Deleted

## 2011-11-18 ENCOUNTER — Emergency Department (HOSPITAL_COMMUNITY)
Admission: EM | Admit: 2011-11-18 | Discharge: 2011-11-18 | Disposition: A | Discharge status: Home / Self Care | Payer: Medicaid Other | Attending: Emergency Medicine | Admitting: Emergency Medicine

## 2011-11-18 DIAGNOSIS — M62838 Other muscle spasm: Secondary | ICD-10-CM | POA: Insufficient documentation

## 2011-11-18 DIAGNOSIS — R05 Cough: Secondary | ICD-10-CM | POA: Insufficient documentation

## 2011-11-18 DIAGNOSIS — R059 Cough, unspecified: Secondary | ICD-10-CM | POA: Insufficient documentation

## 2011-11-18 DIAGNOSIS — Z79899 Other long term (current) drug therapy: Secondary | ICD-10-CM | POA: Insufficient documentation

## 2011-11-18 DIAGNOSIS — M542 Cervicalgia: Secondary | ICD-10-CM | POA: Insufficient documentation

## 2011-11-18 DIAGNOSIS — F329 Major depressive disorder, single episode, unspecified: Secondary | ICD-10-CM | POA: Insufficient documentation

## 2011-11-18 DIAGNOSIS — F3289 Other specified depressive episodes: Secondary | ICD-10-CM | POA: Insufficient documentation

## 2011-11-18 MED ORDER — CYCLOBENZAPRINE HCL 10 MG PO TABS: 10.0000 mg | ORAL_TABLET | Freq: Two times a day (BID) | ORAL | Status: DC | PRN

## 2011-11-18 MED ORDER — TRAMADOL HCL 50 MG PO TABS: 50.0000 mg | ORAL_TABLET | Freq: Four times a day (QID) | ORAL | Status: DC | PRN

## 2011-11-18 NOTE — ED Provider Notes (Signed)
History  This chart was scribed for Shelda Jakes, MD by Bennett Scrape. This patient was seen in room APA04/APA04 and the patient's care was started at 11:58 AM.   CSN: [id number]  Arrival date & time 11/18/11  1147   First MD Initiated Contact with Patient 11/18/11 1158      Chief Complaint  Patient presents with  . Arm Pain     Patient is a 41 y.o. female presenting with arm pain. The history is provided by the patient. No language interpreter was used.  Arm Pain Pertinent negatives include no chest pain, no abdominal pain, no headaches and no shortness of breath.   Wendy Larsen is a 41 y.o. female who presents to the Emergency Department complaining of 4 days of right arm pain described as a throbbing sensation that radiates into her fingers and right neck. She reports that the pain is worse with sudden movement and certain positions. She states that She denies any recent injury as the cause. She rates her pain an 8 out of 10 at its best and a 10 out of 10 at its worst. She denies CP, abdominal pain, nausea, emesis, and fevers as associated symptoms.    Past Medical History  Diagnosis Date  . Depression   . Polysubstance abuse     Past Surgical History  Procedure Date  . Appendectomy   . Tubal ligation   . Ovarian cyst removal     No family history on file.  History  Substance Use Topics  . Smoking status: Not on file  . Smokeless tobacco: Not on file  . Alcohol Use: Yes    No OB history provided.  Review of Systems  Constitutional: Negative for fever and chills.  HENT: Negative for congestion and rhinorrhea.   Eyes: Negative for visual disturbance.  Respiratory: Positive for cough. Negative for shortness of breath.   Cardiovascular: Negative for chest pain.  Gastrointestinal: Negative for nausea, vomiting, abdominal pain and diarrhea.  Genitourinary: Negative for dysuria and frequency.  Musculoskeletal: Negative for back pain.  Skin: Negative for  rash.  Neurological: Negative for numbness and headaches.    Allergies  Aspirin  Home Medications   Current Outpatient Rx  Name Route Sig Dispense Refill  . CITALOPRAM HYDROBROMIDE 20 MG PO TABS Oral Take 20 mg by mouth daily.    . TRAZODONE HCL 150 MG PO TABS Oral Take 150 mg by mouth at bedtime.    . CYCLOBENZAPRINE HCL 10 MG PO TABS Oral Take 1 tablet (10 mg total) by mouth 2 (two) times daily as needed for muscle spasms. 20 tablet 0  . TRAMADOL HCL 50 MG PO TABS Oral Take 1 tablet (50 mg total) by mouth every 6 (six) hours as needed for pain. 15 tablet 0    Triage Vitals: BP 128/77  Pulse 74  Temp 98.1 F (36.7 C) (Oral)  Resp 16  Ht 5\' 1"  (1.549 m)  Wt 160 lb (72.576 kg)  BMI 30.23 kg/m2  SpO2 100%  LMP 11/02/2011  Physical Exam  Nursing note and vitals reviewed. Constitutional: She is oriented to person, place, and time. She appears well-developed and well-nourished. No distress.  HENT:  Head: Normocephalic and atraumatic.  Mouth/Throat: Oropharynx is clear and moist.  Eyes: Conjunctivae normal and EOM are normal.  Neck: Normal range of motion. Neck supple. No tracheal deviation present.  Cardiovascular: Normal rate and regular rhythm.   No murmur heard. Pulmonary/Chest: Effort normal and breath sounds normal. No respiratory  distress. She exhibits no tenderness.  Abdominal: Soft. Bowel sounds are normal. There is no tenderness.  Musculoskeletal: Normal range of motion. She exhibits tenderness. She exhibits no edema.       Left trapezius muscle spasm noted, right trapezius is normal, radial pulse is 2+ on the left, cap refill is <2 seconds, discomfort in neck and shoulder with ROM of the shoulder, no pain with movement of the left elbow or left wrist  Neurological: She is alert and oriented to person, place, and time.  Skin: Skin is warm and dry.  Psychiatric: She has a normal mood and affect. Her behavior is normal.    ED Course  Procedures (including critical  care time)  DIAGNOSTIC STUDIES: Oxygen Saturation is 100% on room air, normal by my interpretation.    COORDINATION OF CARE:    Labs Reviewed - No data to display No results found.   1. Cervical pain   2. Trapezius muscle spasm       MDM  Symptoms consistent with a cervical neck pain with trapezius muscle spasm, some component of a cervical radiculopathy with pain radiating into the arm. No distinct focal neurological deficits at this time.  Will treat with muscle relaxer and pain medicine. I have patient return if not improved in 2 weeks or earlier for any new or worse symptoms. No history of injury.      I personally performed the services described in this documentation, which was scribed in my presence. The recorded information has been reviewed and considered.     Shelda Jakes, MD 11/18/11 1239

## 2011-11-18 NOTE — ED Notes (Signed)
Left arm and neck pain x 4 days. No known injury. Pt states shooting pains down arm, hurts worse with movement, and tingling and numbness to finger tips.

## 2017-01-15 ENCOUNTER — Emergency Department (HOSPITAL_COMMUNITY)
Admission: EM | Admit: 2017-01-15 | Discharge: 2017-01-15 | Discharge status: Against Medical Advice | Payer: Medicaid Other | Source: Home / Self Care

## 2017-01-15 ENCOUNTER — Encounter (HOSPITAL_COMMUNITY): Payer: Self-pay | Admitting: Emergency Medicine

## 2017-01-15 ENCOUNTER — Emergency Department (HOSPITAL_COMMUNITY): Payer: Medicaid Other

## 2017-01-15 ENCOUNTER — Inpatient Hospital Stay (HOSPITAL_COMMUNITY)
Admission: EM | Admit: 2017-01-15 | Discharge: 2017-01-17 | DRG: 639 | Disposition: A | Discharge status: Home / Self Care | Payer: Medicaid Other | Attending: Internal Medicine | Admitting: Internal Medicine

## 2017-01-15 ENCOUNTER — Emergency Department (HOSPITAL_COMMUNITY)
Admission: EM | Admit: 2017-01-15 | Discharge: 2017-01-15 | Disposition: A | Discharge status: Home / Self Care | Payer: Medicaid Other | Source: Home / Self Care

## 2017-01-15 DIAGNOSIS — N760 Acute vaginitis: Secondary | ICD-10-CM | POA: Diagnosis not present

## 2017-01-15 DIAGNOSIS — E1110 Type 2 diabetes mellitus with ketoacidosis without coma: Secondary | ICD-10-CM | POA: Diagnosis not present

## 2017-01-15 DIAGNOSIS — D899 Disorder involving the immune mechanism, unspecified: Secondary | ICD-10-CM | POA: Diagnosis present

## 2017-01-15 DIAGNOSIS — Z23 Encounter for immunization: Secondary | ICD-10-CM

## 2017-01-15 DIAGNOSIS — Z5321 Procedure and treatment not carried out due to patient leaving prior to being seen by health care provider: Secondary | ICD-10-CM

## 2017-01-15 DIAGNOSIS — Z91128 Patient's intentional underdosing of medication regimen for other reason: Secondary | ICD-10-CM

## 2017-01-15 DIAGNOSIS — R1011 Right upper quadrant pain: Secondary | ICD-10-CM

## 2017-01-15 DIAGNOSIS — Z9114 Patient's other noncompliance with medication regimen: Secondary | ICD-10-CM

## 2017-01-15 DIAGNOSIS — R05 Cough: Secondary | ICD-10-CM

## 2017-01-15 DIAGNOSIS — R059 Cough, unspecified: Secondary | ICD-10-CM | POA: Diagnosis present

## 2017-01-15 DIAGNOSIS — F17210 Nicotine dependence, cigarettes, uncomplicated: Secondary | ICD-10-CM | POA: Diagnosis present

## 2017-01-15 DIAGNOSIS — F209 Schizophrenia, unspecified: Secondary | ICD-10-CM | POA: Diagnosis not present

## 2017-01-15 DIAGNOSIS — Z7984 Long term (current) use of oral hypoglycemic drugs: Secondary | ICD-10-CM

## 2017-01-15 DIAGNOSIS — R112 Nausea with vomiting, unspecified: Secondary | ICD-10-CM | POA: Diagnosis present

## 2017-01-15 DIAGNOSIS — L304 Erythema intertrigo: Secondary | ICD-10-CM | POA: Diagnosis not present

## 2017-01-15 DIAGNOSIS — B9689 Other specified bacterial agents as the cause of diseases classified elsewhere: Secondary | ICD-10-CM | POA: Diagnosis present

## 2017-01-15 HISTORY — DX: Schizophrenia, unspecified: F20.9

## 2017-01-15 HISTORY — DX: Type 2 diabetes mellitus without complications: E11.9

## 2017-01-15 LAB — URINALYSIS, ROUTINE W REFLEX MICROSCOPIC
Bacteria, UA: NONE SEEN
Bilirubin Urine: NEGATIVE
HGB URINE DIPSTICK: NEGATIVE
Ketones, ur: 80 mg/dL — AB
Leukocytes, UA: NEGATIVE
Nitrite: NEGATIVE
PROTEIN: NEGATIVE mg/dL
SPECIFIC GRAVITY, URINE: 1.028 (ref 1.005–1.030)
pH: 5 (ref 5.0–8.0)

## 2017-01-15 LAB — GLUCOSE, CAPILLARY
GLUCOSE-CAPILLARY: 175 mg/dL — AB (ref 65–99)
Glucose-Capillary: 174 mg/dL — ABNORMAL HIGH (ref 65–99)
Glucose-Capillary: 169 mg/dL — ABNORMAL HIGH (ref 65–99)

## 2017-01-15 LAB — CBG MONITORING, ED
GLUCOSE-CAPILLARY: 351 mg/dL — AB (ref 65–99)
GLUCOSE-CAPILLARY: 422 mg/dL — AB (ref 65–99)
GLUCOSE-CAPILLARY: 231 mg/dL — AB (ref 65–99)
GLUCOSE-CAPILLARY: 244 mg/dL — AB (ref 65–99)
GLUCOSE-CAPILLARY: 204 mg/dL — AB (ref 65–99)
GLUCOSE-CAPILLARY: 133 mg/dL — AB (ref 65–99)
Glucose-Capillary: >600 mg/dL (ref 65–99)
Glucose-Capillary: 484 mg/dL — ABNORMAL HIGH (ref 65–99)
Glucose-Capillary: 349 mg/dL — ABNORMAL HIGH (ref 65–99)
Glucose-Capillary: 155 mg/dL — ABNORMAL HIGH (ref 65–99)
Glucose-Capillary: 173 mg/dL — ABNORMAL HIGH (ref 65–99)
Glucose-Capillary: 167 mg/dL — ABNORMAL HIGH (ref 65–99)

## 2017-01-15 LAB — COMPREHENSIVE METABOLIC PANEL
ALBUMIN: 3.6 g/dL (ref 3.5–5.0)
ALK PHOS: 137 U/L — AB (ref 38–126)
ALK PHOS: 121 U/L (ref 38–126)
ALT: 17 U/L (ref 14–54)
ALT: 14 U/L (ref 14–54)
ANION GAP: 18 — AB (ref 5–15)
ANION GAP: 15 (ref 5–15)
AST: 14 U/L — AB (ref 15–41)
AST: 13 U/L — ABNORMAL LOW (ref 15–41)
Albumin: 3.4 g/dL — ABNORMAL LOW (ref 3.5–5.0)
BUN: 8 mg/dL (ref 6–20)
BUN: 6 mg/dL (ref 6–20)
CALCIUM: 9.2 mg/dL (ref 8.9–10.3)
CALCIUM: 9 mg/dL (ref 8.9–10.3)
CHLORIDE: 110 mmol/L (ref 101–111)
CO2: 17 mmol/L — ABNORMAL LOW (ref 22–32)
CO2: 17 mmol/L — AB (ref 22–32)
CREATININE: 0.61 mg/dL (ref 0.44–1.00)
Chloride: 97 mmol/L — ABNORMAL LOW (ref 101–111)
Creatinine, Ser: 0.86 mg/dL (ref 0.44–1.00)
GFR calc Af Amer: >60 mL/min (ref >60)
GFR calc non Af Amer: >60 mL/min (ref >60)
GLUCOSE: 677 mg/dL — AB (ref 65–99)
Glucose, Bld: 230 mg/dL — ABNORMAL HIGH (ref 65–99)
POTASSIUM: 4.3 mmol/L (ref 3.5–5.1)
Potassium: 3.8 mmol/L (ref 3.5–5.1)
SODIUM: 142 mmol/L (ref 135–145)
Sodium: 132 mmol/L — ABNORMAL LOW (ref 135–145)
Total Bilirubin: 1.6 mg/dL — ABNORMAL HIGH (ref 0.3–1.2)
Total Bilirubin: 1.4 mg/dL — ABNORMAL HIGH (ref 0.3–1.2)
Total Protein: 7 g/dL (ref 6.5–8.1)
Total Protein: 6.6 g/dL (ref 6.5–8.1)

## 2017-01-15 LAB — CBC
HCT: 43.1 % (ref 36.0–46.0)
HEMATOCRIT: 41.9 % (ref 36.0–46.0)
HEMOGLOBIN: 13.4 g/dL (ref 12.0–15.0)
Hemoglobin: 13.7 g/dL (ref 12.0–15.0)
MCH: 28.5 pg (ref 26.0–34.0)
MCH: 28.6 pg (ref 26.0–34.0)
MCHC: 31.8 g/dL (ref 30.0–36.0)
MCHC: 32 g/dL (ref 30.0–36.0)
MCV: 89.6 fL (ref 78.0–100.0)
MCV: 89.5 fL (ref 78.0–100.0)
PLATELETS: 291 10*3/uL (ref 150–400)
Platelets: 302 10*3/uL (ref 150–400)
RBC: 4.81 MIL/uL (ref 3.87–5.11)
RBC: 4.68 MIL/uL (ref 3.87–5.11)
RDW: 15.1 % (ref 11.5–15.5)
RDW: 15.4 % (ref 11.5–15.5)
WBC: 10.6 10*3/uL — ABNORMAL HIGH (ref 4.0–10.5)
WBC: 10.2 10*3/uL (ref 4.0–10.5)

## 2017-01-15 LAB — WET PREP, GENITAL
TRICH WET PREP: NONE SEEN
Yeast Wet Prep HPF POC: NONE SEEN

## 2017-01-15 LAB — BASIC METABOLIC PANEL
Anion gap: 12 (ref 5–15)
Anion gap: 10 (ref 5–15)
BUN: 5 mg/dL — AB (ref 6–20)
BUN: 5 mg/dL — ABNORMAL LOW (ref 6–20)
CALCIUM: 8.8 mg/dL — AB (ref 8.9–10.3)
CHLORIDE: 105 mmol/L (ref 101–111)
CO2: 20 mmol/L — ABNORMAL LOW (ref 22–32)
CO2: 22 mmol/L (ref 22–32)
CREATININE: 0.49 mg/dL (ref 0.44–1.00)
Calcium: 8.6 mg/dL — ABNORMAL LOW (ref 8.9–10.3)
Chloride: 109 mmol/L (ref 101–111)
Creatinine, Ser: 0.47 mg/dL (ref 0.44–1.00)
GFR calc Af Amer: >60 mL/min (ref >60)
GFR calc non Af Amer: >60 mL/min (ref >60)
GLUCOSE: 152 mg/dL — AB (ref 65–99)
Glucose, Bld: 177 mg/dL — ABNORMAL HIGH (ref 65–99)
POTASSIUM: 3.3 mmol/L — AB (ref 3.5–5.1)
Potassium: 3.5 mmol/L (ref 3.5–5.1)
SODIUM: 141 mmol/L (ref 135–145)
SODIUM: 137 mmol/L (ref 135–145)

## 2017-01-15 LAB — LIPASE, BLOOD: Lipase: 26 U/L (ref 11–51)

## 2017-01-15 LAB — PREGNANCY, URINE: Preg Test, Ur: NEGATIVE

## 2017-01-15 MED ORDER — DEXTROSE-NACL 5-0.45 % IV SOLN
INTRAVENOUS | Status: DC
  Administered 2017-01-15: 23:00:00 via INTRAVENOUS

## 2017-01-15 MED ORDER — PNEUMOCOCCAL VAC POLYVALENT 25 MCG/0.5ML IJ INJ
0.5000 mL | INJECTION | INTRAMUSCULAR | Status: AC
  Administered 2017-01-16: 0.5 mL via INTRAMUSCULAR
  Filled 2017-01-15: qty 0.5

## 2017-01-15 MED ORDER — GUAIFENESIN-DM 100-10 MG/5ML PO SYRP
5.0000 mL | ORAL_SOLUTION | ORAL | Status: DC | PRN
  Administered 2017-01-15 – 2017-01-17 (×7): 5 mL via ORAL
  Filled 2017-01-15 (×7): qty 5

## 2017-01-15 MED ORDER — METRONIDAZOLE 0.75 % VA GEL
1.0000 | Freq: Every day | VAGINAL | Status: DC
  Administered 2017-01-15: 1 via VAGINAL
  Filled 2017-01-15 (×2): qty 70

## 2017-01-15 MED ORDER — ALBUTEROL SULFATE (2.5 MG/3ML) 0.083% IN NEBU: 2.5000 mg | INHALATION_SOLUTION | RESPIRATORY_TRACT | Status: DC | PRN

## 2017-01-15 MED ORDER — TRAZODONE HCL 50 MG PO TABS
150.0000 mg | ORAL_TABLET | Freq: Every day | ORAL | Status: DC
  Administered 2017-01-15 – 2017-01-16 (×2): 150 mg via ORAL
  Filled 2017-01-15 (×2): qty 3

## 2017-01-15 MED ORDER — SODIUM CHLORIDE 0.9 % IV SOLN
INTRAVENOUS | Status: DC
  Administered 2017-01-16: 01:00:00 via INTRAVENOUS

## 2017-01-15 MED ORDER — SODIUM CHLORIDE 0.9 % IV SOLN
INTRAVENOUS | Status: DC
  Administered 2017-01-15: 3.6 [IU]/h via INTRAVENOUS
  Filled 2017-01-15: qty 1

## 2017-01-15 MED ORDER — INSULIN ASPART 100 UNIT/ML ~~LOC~~ SOLN: 0.0000 [IU] | Freq: Every day | SUBCUTANEOUS | Status: DC

## 2017-01-15 MED ORDER — SODIUM CHLORIDE 0.9 % IV BOLUS (SEPSIS)
1000.0000 mL | Freq: Once | INTRAVENOUS | Status: AC
  Administered 2017-01-15: 1000 mL via INTRAVENOUS

## 2017-01-15 MED ORDER — SODIUM CHLORIDE 0.45 % IV SOLN
INTRAVENOUS | Status: DC
  Administered 2017-01-15: 12:00:00 via INTRAVENOUS

## 2017-01-15 MED ORDER — PANTOPRAZOLE SODIUM 40 MG IV SOLR
40.0000 mg | INTRAVENOUS | Status: DC
  Administered 2017-01-15 – 2017-01-16 (×2): 40 mg via INTRAVENOUS
  Filled 2017-01-15 (×2): qty 40

## 2017-01-15 MED ORDER — POTASSIUM CHLORIDE CRYS ER 20 MEQ PO TBCR: 20.0000 meq | EXTENDED_RELEASE_TABLET | ORAL | Status: DC | PRN

## 2017-01-15 MED ORDER — POTASSIUM CHLORIDE 10 MEQ/100ML IV SOLN
10.0000 meq | INTRAVENOUS | Status: AC
  Administered 2017-01-15 (×2): 10 meq via INTRAVENOUS
  Filled 2017-01-15 (×2): qty 100

## 2017-01-15 MED ORDER — ONDANSETRON HCL 4 MG/2ML IJ SOLN
4.0000 mg | Freq: Four times a day (QID) | INTRAMUSCULAR | Status: DC | PRN
  Administered 2017-01-17: 4 mg via INTRAVENOUS
  Filled 2017-01-15: qty 2

## 2017-01-15 MED ORDER — NYSTATIN 100000 UNIT/GM EX OINT
TOPICAL_OINTMENT | Freq: Two times a day (BID) | CUTANEOUS | Status: DC
  Administered 2017-01-15: 23:00:00 via TOPICAL
  Administered 2017-01-16: 1 via TOPICAL
  Administered 2017-01-16: 22:00:00 via TOPICAL
  Administered 2017-01-17: 1 via TOPICAL
  Filled 2017-01-15 (×2): qty 15

## 2017-01-15 MED ORDER — INSULIN GLARGINE 100 UNIT/ML ~~LOC~~ SOLN
20.0000 [IU] | SUBCUTANEOUS | Status: DC
  Administered 2017-01-15: 20 [IU] via SUBCUTANEOUS
  Filled 2017-01-15 (×2): qty 0.2

## 2017-01-15 MED ORDER — INFLUENZA VAC SPLIT QUAD 0.5 ML IM SUSY
0.5000 mL | PREFILLED_SYRINGE | INTRAMUSCULAR | Status: AC
  Administered 2017-01-16: 0.5 mL via INTRAMUSCULAR
  Filled 2017-01-15: qty 0.5

## 2017-01-15 MED ORDER — INSULIN REGULAR HUMAN 100 UNIT/ML IJ SOLN
INTRAMUSCULAR | Status: DC
  Filled 2017-01-15: qty 1

## 2017-01-15 MED ORDER — SODIUM CHLORIDE 0.9 % IV SOLN: INTRAVENOUS | Status: AC

## 2017-01-15 MED ORDER — ONDANSETRON HCL 4 MG/2ML IJ SOLN
4.0000 mg | Freq: Once | INTRAMUSCULAR | Status: AC
Start: 2017-01-15 — End: 2017-01-15
  Administered 2017-01-15: 4 mg via INTRAVENOUS
  Filled 2017-01-15: qty 2

## 2017-01-15 MED ORDER — ACETAMINOPHEN 325 MG PO TABS
650.0000 mg | ORAL_TABLET | Freq: Four times a day (QID) | ORAL | Status: DC | PRN
  Administered 2017-01-15: 650 mg via ORAL
  Filled 2017-01-15: qty 2

## 2017-01-15 MED ORDER — ENOXAPARIN SODIUM 40 MG/0.4ML ~~LOC~~ SOLN
40.0000 mg | SUBCUTANEOUS | Status: DC
  Administered 2017-01-15 – 2017-01-16 (×2): 40 mg via SUBCUTANEOUS
  Filled 2017-01-15 (×2): qty 0.4

## 2017-01-15 MED ORDER — INSULIN ASPART 100 UNIT/ML ~~LOC~~ SOLN
0.0000 [IU] | Freq: Three times a day (TID) | SUBCUTANEOUS | Status: DC
  Administered 2017-01-16: 5 [IU] via SUBCUTANEOUS

## 2017-01-15 MED ORDER — CITALOPRAM HYDROBROMIDE 20 MG PO TABS
20.0000 mg | ORAL_TABLET | Freq: Every day | ORAL | Status: DC
  Administered 2017-01-15 – 2017-01-17 (×3): 20 mg via ORAL
  Filled 2017-01-15 (×3): qty 1

## 2017-01-15 MED ORDER — DEXTROSE-NACL 5-0.45 % IV SOLN
INTRAVENOUS | Status: DC
  Administered 2017-01-15: 14:00:00 via INTRAVENOUS

## 2017-01-15 NOTE — ED Notes (Signed)
Date and time results received: 01/15/17 10:30 AM  (use smartphrase ".now" to insert current time)  Test: Glucose Critical Value: 677  Name of Provider Notified: Adriana Simasook  Orders Received? Or Actions Taken?: Orders Received - See Orders for details

## 2017-01-15 NOTE — ED Notes (Signed)
Pt transported to US

## 2017-01-15 NOTE — ED Provider Notes (Signed)
[facility] EMERGENCY DEPARTMENT Provider Note   CSN: [id number] Arrival date & time: 01/15/17  1941     History   Chief Complaint Chief Complaint  Patient presents with  . Cough  . Hyperglycemia    HPI Wendy Larsen is a 46 y.o. female schizophrenia, DM Type II, alcohol use disorder, and depression who presents to the emergency department with a chief complaint of "I was choking."    The patient reports a history of a non-productive cough over the last several months that became productive with clear sputum over the last week.  She reports associated sharp bilateral chest pain that is only present with coughing.  She reports that this morning while she was eating eggs that she had another coughing episode followed by an episode where she felt like she was choking so she came to the emergency department for evaluation.   She also endorses intermittent NBNB emesis and constant nausea over the last week. Last episode was this morning.  No episodes yesterday, but she reports 2-3 episodes 2 days ago.  She reports that her nausea has been so severe that she has barely been able to eat over the last week so she is mostly maintained a liquid diet. She reports bilateral, crampy lower abdominal pain that she also noticed this AM.  She denies dysuria, frequency, hesitancy, fever, chills, back pain, vaginal bleeding or itching.   She also reports several months ofdyspnea over the last few months, a pruritic, burning rash to her groin, polyuria, and bilateral blurred vision, and malodorous vaginal discharge, all of which have remained constant without aggravating or alleviating factors since onset.   She reports that she was diagnosed with diabetes mellitus approximately 5-6 months ago and was started on metformin.  She was seen by ophthalmology at that time.  She states that a proximally 1 month ago she that her blood sugar was running 200-300 at home and she became frustrated that she was unable to  get it under control so she discontinued her metformin and stopped checking her blood sugar.  She also endorses worsening depressed mood as well as auditory and visual hallucinations over the last 2-3 months.  She states "I keep people calling my name", and "I keep seeing things moving that aren't actually there."  She denies SI or HI. Her pain other reports that she has been under an increased amount of stress with her health as well as her children and worrying about her grandchildren.  The history is provided by the patient. No language interpreter was used.    Past Medical History:  Diagnosis Date  . Depression   . Diabetes mellitus without complication ([place])   . Polysubstance abuse ([place])   . Schizophrenia [facility])     Patient Active Problem List   Diagnosis Date Noted  . DKA (diabetic ketoacidoses) ([place]) 01/15/2017  . 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 DYSPNEA 05/13/2009  . CHEST PAIN 05/13/2009  . ALCOHOL ABUSE 05/05/2009  . SUBSTANCE ABUSE, MULTIPLE 05/05/2009  . DEPRESSION 05/05/2009    Past Surgical History:  Procedure Laterality Date  . APPENDECTOMY    . OVARIAN CYST REMOVAL    . TUBAL LIGATION      OB History    No data available       Home Medications    Prior to Admission medications   Medication Sig Start Date End Date Taking? Authorizing Provider  citalopram (CELEXA) 20 MG tablet Take 20 mg by mouth daily.    [provider]  traZODone (DESYREL) 150 MG tablet Take 150 mg by mouth at bedtime.    [provider]    Family History History reviewed. No pertinent family history.  Social History Social History   Tobacco Use  . Smoking status: Current Every Day Smoker    Packs/day: 1.00    Types: Cigarettes  Substance Use Topics  . Alcohol use: Yes  . Drug use: Yes    Types: Cocaine     Allergies   Aspirin   Review of Systems Review of Systems  Constitutional: Negative for activity change, chills and fever.  HENT: Negative for  congestion.   Eyes: Positive for visual disturbance (blurred).  Respiratory: Positive for cough and shortness of breath.   Cardiovascular: Positive for chest pain.  Gastrointestinal: Positive for abdominal pain, nausea and vomiting. Negative for diarrhea.  Endocrine: Positive for polyuria.  Genitourinary: Positive for vaginal discharge. Negative for dysuria, frequency, urgency, vaginal bleeding and vaginal pain.  Musculoskeletal: Negative for back pain and neck pain.  Skin: Positive for rash.  Allergic/Immunologic: Positive for immunocompromised state.  Neurological: Negative for weakness.  Psychiatric/Behavioral: Positive for hallucinations.     Physical Exam Updated Vital Signs BP 125/83   Pulse 87   Temp 98.6 F (37 C) (Oral)   Resp 15   Ht 5' 1"  (1.549 m)   Wt 67.6 kg (149 lb)   SpO2 97%   BMI 28.15 kg/m   Physical Exam  Constitutional: She is oriented to person, place, and time. No distress.  HENT:  Head: Normocephalic.  Eyes: Conjunctivae and EOM are normal. Pupils are equal, round, and reactive to light.  Neck: Normal range of motion. Neck supple.  Cardiovascular: Normal rate, regular rhythm, normal heart sounds and intact distal pulses. Exam reveals no gallop and no friction rub.  No murmur heard. Pulmonary/Chest: Effort normal and breath sounds normal. No stridor. No respiratory distress. She has no wheezes. She has no rales. She exhibits no tenderness.  Abdominal: Soft. She exhibits no distension and no mass. There is tenderness. There is guarding. There is no rebound. No hernia.  Generalized TTP to the abdomen, more focal over the left lower quadrant and suprapubic area with mild guarding.  No rebound.  No CVA tenderness bilaterally.  No tenderness over McBurney's point.  Negative Murphy sign.  Genitourinary: Uterus normal.    There is rash on the right labia. There is rash on the left labia. Vaginal discharge found.  Genitourinary Comments: Chaperoned exam.  Thick white discharge noted in the vaginal vault. A small amount of yellow discharge is also noted.  No cervical motion tenderness.  No adnexal tenderness bilaterally.  Uterus is normal.   A thick white rash present bilaterally in the inguinal area, noted in the red circular area on attached graphic.    Neurological: She is alert and oriented to person, place, and time.  Skin: Skin is warm. Capillary refill takes less than 2 seconds. Rash noted. She is not diaphoretic.  Psychiatric: Her speech is normal and behavior is normal. She is actively hallucinating. Cognition and memory are normal. She expresses inappropriate judgment. She exhibits a depressed mood. She expresses no homicidal and no suicidal ideation. She expresses no suicidal plans and no homicidal plans.  Nursing note and vitals reviewed.    ED Treatments / Results  Labs (all labs ordered are listed, but only abnormal results are displayed) Labs Reviewed  WET PREP, GENITAL - Abnormal; Notable for the following components:  Result Value   Clue Cells Wet Prep HPF POC PRESENT (*)    WBC, Wet Prep HPF POC MODERATE (*)    All other components within normal limits  CBC - Abnormal; Notable for the following components:   WBC 10.6 (*)    All other components within normal limits  URINALYSIS, ROUTINE W REFLEX MICROSCOPIC - Abnormal; Notable for the following components:   Color, Urine STRAW (*)    Glucose, UA >=500 (*)    Ketones, ur 80 (*)    Squamous Epithelial / LPF 0-5 (*)    All other components within normal limits  COMPREHENSIVE METABOLIC PANEL - Abnormal; Notable for the following components:   Sodium 132 (*)    Chloride 97 (*)    CO2 17 (*)    Glucose, Bld 677 (*)    AST 14 (*)    Alkaline Phosphatase 137 (*)    Total Bilirubin 1.6 (*)    Anion gap 18 (*)    All other components within normal limits  CBG MONITORING, ED - Abnormal; Notable for the following components:   Glucose-Capillary >600 (*)    All other  components within normal limits  CBG MONITORING, ED - Abnormal; Notable for the following components:   Glucose-Capillary 484 (*)    All other components within normal limits  CBG MONITORING, ED - Abnormal; Notable for the following components:   Glucose-Capillary 422 (*)    All other components within normal limits  CBG MONITORING, ED - Abnormal; Notable for the following components:   Glucose-Capillary 351 (*)    All other components within normal limits  CBG MONITORING, ED - Abnormal; Notable for the following components:   Glucose-Capillary 349 (*)    All other components within normal limits  CBG MONITORING, ED - Abnormal; Notable for the following components:   Glucose-Capillary 231 (*)    All other components within normal limits  PREGNANCY, URINE  LIPASE, BLOOD  COMPREHENSIVE METABOLIC PANEL  GC/CHLAMYDIA PROBE AMP ([place]) NOT AT [facility]    EKG  EKG Interpretation  Date/Time:  Saturday January 15 2017 10:06:39 EST Ventricular Rate:  82 PR Interval:    QRS Duration: 71 QT Interval:  380 QTC Calculation: 444 R Axis:   84 Text Interpretation:  Sinus rhythm Probable anteroseptal infarct, old Confirmed by Nat Christen [phone]) on 01/15/2017 10:09:17 AM       Radiology Dg Chest 2 View  Result Date: 01/15/2017 CLINICAL DATA:  Vomiting and coughing for 2 weeks. EXAM: CHEST  2 VIEW COMPARISON:  April 15, 2009 FINDINGS: The heart size and mediastinal contours are within normal limits. There is no focal infiltrate, pulmonary edema, or pleural effusion. The visualized skeletal structures are unremarkable. IMPRESSION: No active cardiopulmonary disease. Electronically Signed   By: Abelardo Diesel M.D.   On: 01/15/2017 10:21   US Abdomen Limited Ruq  Result Date: 01/15/2017 CLINICAL DATA:  Right upper quadrant pain. EXAM: ULTRASOUND ABDOMEN LIMITED RIGHT UPPER QUADRANT COMPARISON:  None. FINDINGS: Gallbladder: No evidence for gallstones. Gallbladder wall thickness upper normal  at 3 mm. 5 mm echogenic non mobile polyp identified. Sonographer reports no sonographic Murphy sign. Common bile duct: Diameter: 4 mm Liver: Coarsening of the hepatic echotexture suggests fatty deposition. Portal vein is patent on color Doppler imaging with normal direction of blood flow towards the liver. IMPRESSION: No definite findings to explain the patient's history of right upper quadrant abdominal pain. Electronically Signed   By: Misty Stanley M.D.   On: 01/15/2017 12:23  Procedures Procedures (including critical care time)  Medications Ordered in ED Medications  insulin regular (NOVOLIN R,HUMULIN R) 100 Units in sodium chloride 0.9 % 100 mL (1 Units/mL) infusion (0 Units/hr Intravenous Stopped 01/15/17 1340)  0.45 % sodium chloride infusion ( Intravenous New Bag/Given 01/15/17 1151)  potassium chloride SA (K-DUR,KLOR-CON) CR tablet 20 mEq (not administered)  dextrose 5 %-0.45 % sodium chloride infusion ( Intravenous New Bag/Given 01/15/17 1409)  sodium chloride 0.9 % bolus 1,000 mL (0 mLs Intravenous Stopped 01/15/17 1028)  sodium chloride 0.9 % bolus 1,000 mL (0 mLs Intravenous Stopped 01/15/17 1142)  ondansetron (ZOFRAN) injection 4 mg (4 mg Intravenous Given 01/15/17 1133)     Initial Impression / Assessment and Plan / ED Course  I have reviewed the triage vital signs and the nursing notes.  Pertinent labs & imaging results that were available during my care of the patient were reviewed by me and considered in my medical decision making (see chart for details).     46 year old female with a history of DM Type II who discontinued her home metformin after 1 month ago after she became frustrated that her blood sugar readings were between 200-300 at home.  She presents with nausea, vomiting, chest pain abdominal pain, vaginal discharge, rash, polyuria, and blurred vision.  The patient was given 2 1 L boluses of normal saline pending lab results. Initial CBG >600 and 677 on CMP.   Anion gap 18, bicarb 17. Glucose stabilizer orders initiated with half normal saline and KCL, improving her CBG to 231. 5% dextrose ordered at 125 mL/hr. Total bilirubin 1.6, alk phos 137.  On reexamination, she has tenderness in the bilateral upper quadrants.  Right upper quadrant ultrasound negative for cholecystitis; however, a 5 mm echogenic nonmobile polyp is noted as well as coarsening of the hepatic echotexture suggesting fatty deposition.  Review of the patient's medical record indicates that she has a history of alcohol abuse; however AST/ALT ratio not 2:1 today.  EKG without acute changes.  Chest x-ray is unremarkable.  UA is not concerning for infection.  On physical exam, candidal infection noted in the intertriginous area.  Wet prep with yellowish/white d/c concerning for yeast; however wet prep results are negative for yeast. Clue cells present; GC chlamydia pending.  I suspect the etiology of her abdominal pain is vaginitis coupled with DKA.  No concern for PID.   The patient has been seen and evaluated with Dr. Lacinda Axon, attending physician.  No emesis since arrival to the ED.  Nausea controlled with Zofran. Discussed the patient with the hospitalist, Dr. Roderic Palau who will admit the patient for continued evaluation and workup. The patient appears reasonably stabilized for admission considering the current resources, flow, and capabilities available in the ED at this time, and I doubt any other [facility] requiring further screening and/or treatment in the ED prior to admission.   Final Clinical Impressions(s) / ED Diagnoses   Final diagnoses:  RUQ pain  Diabetic ketoacidosis without coma associated with type 2 diabetes mellitus [facility])    ED Discharge Orders    None       Joanne Gavel, PA-C 01/15/17 1414    Nat Christen, MD 01/16/17 [phone]

## 2017-01-15 NOTE — ED Notes (Signed)
Per registration, the pt left. 

## 2017-01-15 NOTE — ED Notes (Signed)
Pt LWBS 

## 2017-01-15 NOTE — ED Triage Notes (Signed)
Pt reports coughing for several months and vomiting x 2 weeks.  Denies diarrhea.  Also rash between legs.

## 2017-01-15 NOTE — ED Notes (Signed)
Pt transported to Xray. 

## 2017-01-15 NOTE — ED Notes (Signed)
Pt requesting food and reporting cramps in left hand. Notified Dr Kerry HoughMemon. Gave order to get stat BMP and liquid diet to see if able to tolerate

## 2017-01-15 NOTE — ED Notes (Signed)
Have given pt a liquid meal tray

## 2017-01-15 NOTE — H&P (Signed)
History and Physical    Wendy Larsen TDD:[id number] DOB: 1970/09/06 DOA: 01/15/2017  PCP: System, Pcp Not In  Patient coming from: home  I have personally briefly reviewed patient's old medical records in [place]  Chief Complaint: coughing, nausea, vomiting  HPI: Wendy Larsen is a 46 y.o. female with medical history significant of diabetes, schizophrenia, comes to the hospital with a 2-3-week history of persistent cough, wheezing, nausea, vomiting.  She reports in the past few days her symptoms have gotten worse and she is been unable to keep anything down.  When she tries to eat or drink something, she sometimes feels as though she is choking.  She has not had any fever.  She has felt constipated recently, no diarrhea.  No dysuria.  She has not noticed a rash in her groin.  She has occasional abdominal cramps.  She is also noted polyuria, blurry vision for the past 2 weeks.  She reports recently being diagnosed with diabetes and started on metformin in the past several months.  Approximately 2 months ago, she noticed that her blood sugars are running in the 200-300 range and became very frustrated that despite taking metformin her blood sugars were uncontrolled.  She then stopped taking all her medications.  She reports that she has been hearing voices and seeing people that she knows are not there.  She denies any suicidal or homicidal ideations.  She was hearing these voices despite taking her psychiatric meds and stopped taking them approximately 2 months ago.  She continues to hear these voices, but has not taken any medications in approximately 2 months.  She has been unable to get in with her psychiatrist and reports having an appointment in the next 2 weeks.  ED Course: Patient was noted to have elevated blood glucose over 600.  Bicarb of 17 and an anion gap of 18.  Patient was started on IV fluids and insulin infusion per DKA protocol.  Chest x-ray did not show any acute findings.   Urinalysis did not show any signs of infection.  She underwent pelvic exam that indicated clue cells on wet prep.  Remainder of workup was unrevealing.  Patient received full liquids in the emergency room and did not have any episodes of choking or evidence of aspiration.  Review of Systems: As per HPI otherwise 10 point review of systems negative.   Past Medical History:  Diagnosis Date  . Depression   . Diabetes mellitus without complication ([place])   . Polysubstance abuse ([place])   . Schizophrenia [facility])     Past Surgical History:  Procedure Laterality Date  . APPENDECTOMY    . OVARIAN CYST REMOVAL    . TUBAL LIGATION       reports that she has been smoking cigarettes.  She has been smoking about 1.00 pack per day. She does not have any smokeless tobacco history on file. She reports that she drinks alcohol. She reports that she uses drugs. Drug: Cocaine.  Allergies  Allergen Reactions  . Aspirin Nausea And Vomiting and Other (See Comments)    Upset stomach    Family history: Family history reviewed and not pertinent  Prior to Admission medications   Medication Sig Start Date End Date Taking? Authorizing Provider  citalopram (CELEXA) 20 MG tablet Take 20 mg by mouth daily.    [provider]  traZODone (DESYREL) 150 MG tablet Take 150 mg by mouth at bedtime.    [provider]    Physical  Exam: Vitals:   01/15/17 1700 01/15/17 1730 01/15/17 1800 01/15/17 1830  BP: 106/72 111/84 128/70 134/77  Pulse: 93 95 84 96  Resp: (!) 26 (!) 24 (!) 27 (!) 23  Temp:      TempSrc:      SpO2: 94% 96% 95% 95%  Weight:      Height:        Constitutional: NAD, calm, comfortable Vitals:   01/15/17 1700 01/15/17 1730 01/15/17 1800 01/15/17 1830  BP: 106/72 111/84 128/70 134/77  Pulse: 93 95 84 96  Resp: (!) 26 (!) 24 (!) 27 (!) 23  Temp:      TempSrc:      SpO2: 94% 96% 95% 95%  Weight:      Height:       Eyes: PERRL, lids and conjunctivae normal ENMT: Mucous  membranes are moist. Posterior pharynx clear of any exudate or lesions.Normal dentition.  Neck: normal, supple, no masses, no thyromegaly Respiratory: clear to auscultation bilaterally, no wheezing, no crackles. Normal respiratory effort. No accessory muscle use.  Cardiovascular: Regular rate and rhythm, no murmurs / rubs / gallops. No extremity edema. 2+ pedal pulses. No carotid bruits.  Abdomen: no tenderness, no masses palpated. No hepatosplenomegaly. Bowel sounds positive.  Musculoskeletal: no clubbing / cyanosis. No joint deformity upper and lower extremities. Good ROM, no contractures. Normal muscle tone.  Skin: intertriginous rash groin and vaginal area Neurologic: CN 2-12 grossly intact. Sensation intact, DTR normal. Strength 5/5 in all 4.  Psychiatric: Normal judgment and insight. Alert and oriented x 3. Normal mood.    Labs on Admission: I have personally reviewed following labs and imaging studies  CBC: Recent Labs  Lab 01/15/17 0846  WBC 10.6*  HGB 13.7  HCT 43.1  MCV 89.6  PLT 034   Basic Metabolic Panel: Recent Labs  Lab 01/15/17 0846 01/15/17 1357 01/15/17 1742  NA 132* 142 141  K 4.3 3.8 3.5  CL 97* 110 109  CO2 17* 17* 20*  GLUCOSE 677* 230* 152*  BUN 8 6 5*  CREATININE 0.86 0.61 0.47  CALCIUM 9.2 9.0 8.8*   GFR: Estimated Creatinine Clearance: 76.4 mL/min (by C-G formula based on SCr of 0.47 mg/dL). Liver Function Tests: Recent Labs  Lab 01/15/17 0846 01/15/17 1357  AST 14* 13*  ALT 17 14  ALKPHOS 137* 121  BILITOT 1.6* 1.4*  PROT 7.0 6.6  ALBUMIN 3.6 3.4*   Recent Labs  Lab 01/15/17 0846  LIPASE 26   No results for input(s): AMMONIA in the last 168 hours. Coagulation Profile: No results for input(s): INR, PROTIME in the last 168 hours. Cardiac Enzymes: No results for input(s): CKTOTAL, CKMB, CKMBINDEX, TROPONINI in the last 168 hours. BNP (last 3 results) No results for input(s): PROBNP in the last 8760 hours. HbA1C: No results for  input(s): HGBA1C in the last 72 hours. CBG: Recent Labs  Lab 01/15/17 1345 01/15/17 1454 01/15/17 1558 01/15/17 1702 01/15/17 1806  GLUCAP 231* 244* 204* 133* 155*   Lipid Profile: No results for input(s): CHOL, HDL, LDLCALC, TRIG, CHOLHDL, LDLDIRECT in the last 72 hours. Thyroid Function Tests: No results for input(s): TSH, T4TOTAL, FREET4, T3FREE, THYROIDAB in the last 72 hours. Anemia Panel: No results for input(s): VITAMINB12, FOLATE, FERRITIN, TIBC, IRON, RETICCTPCT in the last 72 hours. Urine analysis:    Component Value Date/Time   COLORURINE STRAW (A) 01/15/2017 0928   APPEARANCEUR CLEAR 01/15/2017 0928   LABSPEC 1.028 01/15/2017 0928   PHURINE 5.0 01/15/2017 9179  GLUCOSEU >=500 (A) 01/15/2017 0928   HGBUR NEGATIVE 01/15/2017 0928   BILIRUBINUR NEGATIVE 01/15/2017 0928   KETONESUR 80 (A) 01/15/2017 0928   PROTEINUR NEGATIVE 01/15/2017 0928   UROBILINOGEN 0.2 04/15/2009 0537   NITRITE NEGATIVE 01/15/2017 0928   LEUKOCYTESUR NEGATIVE 01/15/2017 0928    Radiological Exams on Admission: Dg Chest 2 View  Result Date: 01/15/2017 CLINICAL DATA:  Vomiting and coughing for 2 weeks. EXAM: CHEST  2 VIEW COMPARISON:  April 15, 2009 FINDINGS: The heart size and mediastinal contours are within normal limits. There is no focal infiltrate, pulmonary edema, or pleural effusion. The visualized skeletal structures are unremarkable. IMPRESSION: No active cardiopulmonary disease. Electronically Signed   By: Abelardo Diesel M.D.   On: 01/15/2017 10:21   US Abdomen Limited Ruq  Result Date: 01/15/2017 CLINICAL DATA:  Right upper quadrant pain. EXAM: ULTRASOUND ABDOMEN LIMITED RIGHT UPPER QUADRANT COMPARISON:  None. FINDINGS: Gallbladder: No evidence for gallstones. Gallbladder wall thickness upper normal at 3 mm. 5 mm echogenic non mobile polyp identified. Sonographer reports no sonographic Murphy sign. Common bile duct: Diameter: 4 mm Liver: Coarsening of the hepatic echotexture suggests  fatty deposition. Portal vein is patent on color Doppler imaging with normal direction of blood flow towards the liver. IMPRESSION: No definite findings to explain the patient's history of right upper quadrant abdominal pain. Electronically Signed   By: Misty Stanley M.D.   On: 01/15/2017 12:23    EKG: Independently reviewed. No acute changes  Assessment/Plan Active Problems:   DKA (diabetic ketoacidoses) (HCC)   Intertrigo   Schizophrenia (HCC)   Bacterial vaginosis   Nausea & vomiting   Cough    1. Diabetic ketoacidosis.  Patient has been started on insulin infusion per DKA protocol.  Anion gap is improving.  We will transition to Lantus once parameters are met.  Start on liquid diet to ensure she can adequately take p.o. prior to transitioning her to subcutaneous insulin.  Check hemoglobin A1c 2. Schizophrenia.  The patient is having visual and auditory hallucinations.  She reports of multiple medications, low desire.  We have discussed.  She needs to be restarted on her medications.  She denies any suicidal or homicidal ideations.  She will need close follow-up with her primary psychiatrist. 3. Bacterial vaginosis.  Clue cells found on wet prep.  Started on vaginal metronidazole.  GC and Chlamydia still in process 4. Intertrigo.  Treat with nystatin cream 5. Nausea and vomiting.  Possibly related to DKA versus viral illness.  Treat supportively with antiemetics.  Advance diet as tolerated.  Right upper quadrant ultrasound does not show any significant findings in gallbladder. 6. Cough.  Question related to esophagitis/gastritis.  Also possibly related to upper respiratory tract infections process.  Continue to treat supportively.  Chest x-ray does not show any evidence of pneumonia.  DVT prophylaxis: lovenox Code Status: full code Family Communication:no family present Disposition Plan: discharge home once improved Consults called:  Admission status: observation, tele   Kathie Dike MD [facility] Pager [phone]  If 7PM-7AM, please contact night-coverage [email or web address] Password [facility]  01/15/2017, 7:14 PM

## 2017-01-16 DIAGNOSIS — Z9114 Patient's other noncompliance with medication regimen: Secondary | ICD-10-CM | POA: Diagnosis not present

## 2017-01-16 DIAGNOSIS — Z7984 Long term (current) use of oral hypoglycemic drugs: Secondary | ICD-10-CM | POA: Diagnosis not present

## 2017-01-16 DIAGNOSIS — Z23 Encounter for immunization: Secondary | ICD-10-CM | POA: Diagnosis not present

## 2017-01-16 DIAGNOSIS — N760 Acute vaginitis: Secondary | ICD-10-CM | POA: Diagnosis not present

## 2017-01-16 DIAGNOSIS — E1110 Type 2 diabetes mellitus with ketoacidosis without coma: Secondary | ICD-10-CM | POA: Diagnosis not present

## 2017-01-16 DIAGNOSIS — L304 Erythema intertrigo: Secondary | ICD-10-CM | POA: Diagnosis present

## 2017-01-16 DIAGNOSIS — B9689 Other specified bacterial agents as the cause of diseases classified elsewhere: Secondary | ICD-10-CM

## 2017-01-16 DIAGNOSIS — R112 Nausea with vomiting, unspecified: Secondary | ICD-10-CM | POA: Diagnosis not present

## 2017-01-16 DIAGNOSIS — Z91128 Patient's intentional underdosing of medication regimen for other reason: Secondary | ICD-10-CM | POA: Diagnosis not present

## 2017-01-16 DIAGNOSIS — F203 Undifferentiated schizophrenia: Secondary | ICD-10-CM | POA: Diagnosis not present

## 2017-01-16 DIAGNOSIS — F17210 Nicotine dependence, cigarettes, uncomplicated: Secondary | ICD-10-CM | POA: Diagnosis present

## 2017-01-16 DIAGNOSIS — D899 Disorder involving the immune mechanism, unspecified: Secondary | ICD-10-CM | POA: Diagnosis present

## 2017-01-16 DIAGNOSIS — F209 Schizophrenia, unspecified: Secondary | ICD-10-CM | POA: Diagnosis present

## 2017-01-16 LAB — GLUCOSE, CAPILLARY
GLUCOSE-CAPILLARY: 176 mg/dL — AB (ref 65–99)
GLUCOSE-CAPILLARY: 188 mg/dL — AB (ref 65–99)
GLUCOSE-CAPILLARY: 218 mg/dL — AB (ref 65–99)
GLUCOSE-CAPILLARY: 243 mg/dL — AB (ref 65–99)
GLUCOSE-CAPILLARY: 328 mg/dL — AB (ref 65–99)
Glucose-Capillary: 225 mg/dL — ABNORMAL HIGH (ref 65–99)

## 2017-01-16 LAB — BASIC METABOLIC PANEL
ANION GAP: 10 (ref 5–15)
Anion gap: 12 (ref 5–15)
Anion gap: 14 (ref 5–15)
BUN: <5 mg/dL — ABNORMAL LOW (ref 6–20)
CHLORIDE: 106 mmol/L (ref 101–111)
CO2: 18 mmol/L — ABNORMAL LOW (ref 22–32)
CO2: 17 mmol/L — ABNORMAL LOW (ref 22–32)
CO2: 20 mmol/L — ABNORMAL LOW (ref 22–32)
CREATININE: 0.5 mg/dL (ref 0.44–1.00)
CREATININE: 0.53 mg/dL (ref 0.44–1.00)
Calcium: 8.6 mg/dL — ABNORMAL LOW (ref 8.9–10.3)
Calcium: 8.7 mg/dL — ABNORMAL LOW (ref 8.9–10.3)
Calcium: 8.6 mg/dL — ABNORMAL LOW (ref 8.9–10.3)
Chloride: 106 mmol/L (ref 101–111)
Chloride: 107 mmol/L (ref 101–111)
Creatinine, Ser: 0.54 mg/dL (ref 0.44–1.00)
GFR calc Af Amer: >60 mL/min (ref >60)
GFR calc Af Amer: >60 mL/min (ref >60)
GFR calc Af Amer: >60 mL/min (ref >60)
GLUCOSE: 219 mg/dL — AB (ref 65–99)
GLUCOSE: 253 mg/dL — AB (ref 65–99)
Glucose, Bld: 298 mg/dL — ABNORMAL HIGH (ref 65–99)
POTASSIUM: 3.7 mmol/L (ref 3.5–5.1)
POTASSIUM: 4.2 mmol/L (ref 3.5–5.1)
POTASSIUM: 3.7 mmol/L (ref 3.5–5.1)
SODIUM: 136 mmol/L (ref 135–145)
Sodium: 136 mmol/L (ref 135–145)
Sodium: 138 mmol/L (ref 135–145)

## 2017-01-16 LAB — HEMOGLOBIN A1C
Hgb A1c MFr Bld: 16.5 % — ABNORMAL HIGH (ref 4.8–5.6)
Mean Plasma Glucose: 426.85 mg/dL

## 2017-01-16 LAB — MRSA PCR SCREENING: MRSA by PCR: NEGATIVE

## 2017-01-16 MED ORDER — INSULIN ASPART 100 UNIT/ML ~~LOC~~ SOLN
0.0000 [IU] | Freq: Every day | SUBCUTANEOUS | Status: DC
  Administered 2017-01-16: 2 [IU] via SUBCUTANEOUS

## 2017-01-16 MED ORDER — INSULIN ASPART 100 UNIT/ML ~~LOC~~ SOLN
0.0000 [IU] | Freq: Three times a day (TID) | SUBCUTANEOUS | Status: DC
  Administered 2017-01-16: 5 [IU] via SUBCUTANEOUS
  Administered 2017-01-16: 11 [IU] via SUBCUTANEOUS
  Administered 2017-01-17: 3 [IU] via SUBCUTANEOUS
  Administered 2017-01-17: 8 [IU] via SUBCUTANEOUS

## 2017-01-16 MED ORDER — METRONIDAZOLE 500 MG PO TABS
2000.0000 mg | ORAL_TABLET | Freq: Once | ORAL | Status: AC
  Administered 2017-01-16: 2000 mg via ORAL
  Filled 2017-01-16: qty 4

## 2017-01-16 MED ORDER — INSULIN ASPART 100 UNIT/ML ~~LOC~~ SOLN
4.0000 [IU] | Freq: Three times a day (TID) | SUBCUTANEOUS | Status: DC
  Administered 2017-01-16 – 2017-01-17 (×4): 4 [IU] via SUBCUTANEOUS

## 2017-01-16 MED ORDER — SODIUM CHLORIDE 0.9 % IV SOLN
INTRAVENOUS | Status: DC
  Administered 2017-01-16 – 2017-01-17 (×3): 1 mL via INTRAVENOUS
  Administered 2017-01-17: 01:00:00 via INTRAVENOUS

## 2017-01-16 MED ORDER — INSULIN GLARGINE 100 UNIT/ML ~~LOC~~ SOLN
25.0000 [IU] | SUBCUTANEOUS | Status: DC
  Administered 2017-01-16: 25 [IU] via SUBCUTANEOUS
  Filled 2017-01-16 (×3): qty 0.25

## 2017-01-16 NOTE — Plan of Care (Addendum)
Discussed with patient the importance of checking her blood glucose per her MD order. Also discussed the importance of taking all prescribed medications as ordered. Also gave out information about diabetic diet and discussed smoking cessation.

## 2017-01-16 NOTE — Progress Notes (Signed)
PROGRESS NOTE    Wendy Larsen T Prentiss  WUJ:[id number] DOB: 12/03/1970 DOA: 01/15/2017 PCP: System, Pcp Not In     Brief Narrative:  46 year old woman admitted from home on 12/29 due to nausea, vomiting as well as coughing.  She has a history of diabetes, and schizophrenia.  Her nausea and vomiting had gotten worse to the point where she was unable to tolerate any oral intake.  She was diagnosed with diabetes about 3 months ago and placed on metformin, however she quickly got disillusioned and states she was depressed because her sugars remained in the 200-300 range so she quit taking her metformin.  She also stopped taking all of her schizophrenic medications around the same timeframe.  She was found to be in DKA and was admitted on IV insulin.  Was also noted to have bacterial vaginosis.   Assessment & Plan:   Active Problems:   DKA (diabetic ketoacidoses) (HCC)   Intertrigo   Schizophrenia (HCC)   Bacterial vaginosis   Nausea & vomiting   Cough   Diabetic ketoacidosis -Was initially placed on IV insulin and has since been transitioned off. -Unfortunately on chemistry panel today her CO2 is back down to 17. -We will try and aggressively hydrate her as well as give her IV insulin and Lantus to see if we can avoid her slipping back into DKA and placing her on an insulin drip. -Saline at 125 cc an hour, moderate sliding scale with mealtime and nighttime coverage as well as increasing her Lantus from 20-25 units. -We will repeat basic metabolic profile at around 2 PM and make a decision based on that if she needs to be placed back on insulin drip.  Schizophrenia -On admission she reported visual and auditory hallucinations, she self quit taking all of her psychotropic medications approximately 2 months ago. -We will request inpatient psychiatry consultation for medication recommendations. -She is most certainly not suicidal.  Bacterial vaginosis -Was found to have clue cells on wet prep,  was started on vaginal metronidazole. -We will discontinue vaginal dosing and give a one-time dose of Flagyl 2000 mg by mouth as she has not been vomiting since admission.  Intertrigo -Nystatin cream.  Nausea and vomiting -Suspect due to DKA. -Improved.  Cough -Chest x-ray without evidence for pneumonia, continue to treat supportively.   DVT prophylaxis: Lovenox Code Status: Full code Family Communication: Patient only Disposition Plan: Keep in ICU pending results of afternoon basic metabolic profile to determine if needs to be placed back on insulin drip.  Consultants:   None  Procedures:   None  Antimicrobials:  Anti-infectives (From admission, onward)   Start     Dose/Rate Route Frequency Ordered Stop   01/16/17 1015  metroNIDAZOLE (FLAGYL) tablet 2,000 mg     2,000 mg Oral  Once 01/16/17 1003         Subjective: No emesis since admission, nausea is improved, still with cough.  Objective: Vitals:   01/16/17 0600 01/16/17 0700 01/16/17 0800 01/16/17 0900  BP: 111/74 123/82 122/71   Pulse: 78 79 78 86  Resp: (!) 23 (!) 22  19  Temp:   98.7 F (37.1 C)   TempSrc:   Oral   SpO2: 94% 95% 95% 96%  Weight:      Height:        Intake/Output Summary (Last 24 hours) at 01/16/2017 1009 Last data filed at 01/16/2017 0900 Gross per 24 hour  Intake 1122.53 ml  Output 550 ml  Net 572.53 ml  Filed Weights   01/15/17 0841 01/15/17 2110 01/16/17 0500  Weight: 67.6 kg (149 lb) 97.2 kg (214 lb 4.6 oz) 97.2 kg (214 lb 4.6 oz)    Examination:  General exam: Alert, awake, oriented x 3 Respiratory system: Coarse bilateral breath sounds, no wheezing or rhonchi Cardiovascular system:RRR. No murmurs, rubs, gallops. Gastrointestinal system: Abdomen is nondistended, soft and nontender. No organomegaly or masses felt. Normal bowel sounds heard. Central nervous system: Alert and oriented. No focal neurological deficits. Extremities: No C/C/E, +pedal pulses Skin: No  rashes, lesions or ulcers Psychiatry: Depressed mood, flat affect    Data Reviewed: I have personally reviewed following labs and imaging studies  CBC: Recent Labs  Lab 01/15/17 0846 01/15/17 2211  WBC 10.6* 10.2  HGB 13.7 13.4  HCT 43.1 41.9  MCV 89.6 89.5  PLT 291 302   Basic Metabolic Panel: Recent Labs  Lab 01/15/17 1357 01/15/17 1742 01/15/17 2211 01/16/17 0420 01/16/17 0827  NA 142 141 137 136 138  K 3.8 3.5 3.3* 3.7 4.2  CL 110 109 105 106 107  CO2 17* 20* 22 18* 17*  GLUCOSE 230* 152* 177* 219* 253*  BUN 6 5* 5* <5* <5*  CREATININE 0.61 0.47 0.49 0.50 0.53  CALCIUM 9.0 8.8* 8.6* 8.6* 8.7*   GFR: Estimated Creatinine Clearance: 92.8 mL/min (by C-G formula based on SCr of 0.53 mg/dL). Liver Function Tests: Recent Labs  Lab 01/15/17 0846 01/15/17 1357  AST 14* 13*  ALT 17 14  ALKPHOS 137* 121  BILITOT 1.6* 1.4*  PROT 7.0 6.6  ALBUMIN 3.6 3.4*   Recent Labs  Lab 01/15/17 0846  LIPASE 26   No results for input(s): AMMONIA in the last 168 hours. Coagulation Profile: No results for input(s): INR, PROTIME in the last 168 hours. Cardiac Enzymes: No results for input(s): CKTOTAL, CKMB, CKMBINDEX, TROPONINI in the last 168 hours. BNP (last 3 results) No results for input(s): PROBNP in the last 8760 hours. HbA1C: No results for input(s): HGBA1C in the last 72 hours. CBG: Recent Labs  Lab 01/15/17 2236 01/15/17 2327 01/16/17 0027 01/16/17 0135 01/16/17 0753  GLUCAP 169* 175* 188* 176* 243*   Lipid Profile: No results for input(s): CHOL, HDL, LDLCALC, TRIG, CHOLHDL, LDLDIRECT in the last 72 hours. Thyroid Function Tests: No results for input(s): TSH, T4TOTAL, FREET4, T3FREE, THYROIDAB in the last 72 hours. Anemia Panel: No results for input(s): VITAMINB12, FOLATE, FERRITIN, TIBC, IRON, RETICCTPCT in the last 72 hours. Urine analysis:    Component Value Date/Time   COLORURINE STRAW (A) 01/15/2017 0928   APPEARANCEUR CLEAR 01/15/2017 0928    LABSPEC 1.028 01/15/2017 0928   PHURINE 5.0 01/15/2017 0928   GLUCOSEU >=500 (A) 01/15/2017 0928   HGBUR NEGATIVE 01/15/2017 0928   BILIRUBINUR NEGATIVE 01/15/2017 0928   KETONESUR 80 (A) 01/15/2017 0928   PROTEINUR NEGATIVE 01/15/2017 0928   UROBILINOGEN 0.2 04/15/2009 0537   NITRITE NEGATIVE 01/15/2017 0928   LEUKOCYTESUR NEGATIVE 01/15/2017 0928   Sepsis Labs: @LABRCNTIP (procalcitonin:4,lacticidven:4)  ) Recent Results (from the past 240 hour(s))  Wet prep, genital     Status: Abnormal   Collection Time: 01/15/17 11:15 AM  Result Value Ref Range Status   Yeast Wet Prep HPF POC NONE SEEN NONE SEEN Final   Trich, Wet Prep NONE SEEN NONE SEEN Final   Clue Cells Wet Prep HPF POC PRESENT (A) NONE SEEN Final   WBC, Wet Prep HPF POC MODERATE (A) NONE SEEN Final   Sperm PRESENT  Final  MRSA PCR Screening  Status: None   Collection Time: 01/15/17  9:20 PM  Result Value Ref Range Status   MRSA by PCR NEGATIVE NEGATIVE Final    Comment:        The GeneXpert MRSA Assay (FDA approved for NASAL specimens only), is one component of a comprehensive MRSA colonization surveillance program. It is not intended to diagnose MRSA infection nor to guide or monitor treatment for MRSA infections.          Radiology Studies: Dg Chest 2 View  Result Date: 01/15/2017 CLINICAL DATA:  Vomiting and coughing for 2 weeks. EXAM: CHEST  2 VIEW COMPARISON:  April 15, 2009 FINDINGS: The heart size and mediastinal contours are within normal limits. There is no focal infiltrate, pulmonary edema, or pleural effusion. The visualized skeletal structures are unremarkable. IMPRESSION: No active cardiopulmonary disease. Electronically Signed   By: Sherian Rein M.D.   On: 01/15/2017 10:21   US Abdomen Limited Ruq  Result Date: 01/15/2017 CLINICAL DATA:  Right upper quadrant pain. EXAM: ULTRASOUND ABDOMEN LIMITED RIGHT UPPER QUADRANT COMPARISON:  None. FINDINGS: Gallbladder: No evidence for gallstones.  Gallbladder wall thickness upper normal at 3 mm. 5 mm echogenic non mobile polyp identified. Sonographer reports no sonographic Murphy sign. Common bile duct: Diameter: 4 mm Liver: Coarsening of the hepatic echotexture suggests fatty deposition. Portal vein is patent on color Doppler imaging with normal direction of blood flow towards the liver. IMPRESSION: No definite findings to explain the patient's history of right upper quadrant abdominal pain. Electronically Signed   By: Kennith Center M.D.   On: 01/15/2017 12:23        Scheduled Meds: . citalopram  20 mg Oral Daily  . enoxaparin (LOVENOX) injection  40 mg Subcutaneous Q24H  . Influenza vac split quadrivalent PF  0.5 mL Intramuscular Tomorrow-1000  . insulin aspart  0-15 Units Subcutaneous TID WC  . insulin aspart  0-5 Units Subcutaneous QHS  . insulin aspart  4 Units Subcutaneous TID WC  . insulin glargine  25 Units Subcutaneous Q24H  . metroNIDAZOLE  2,000 mg Oral Once  . nystatin ointment   Topical BID  . pantoprazole (PROTONIX) IV  40 mg Intravenous Q24H  . pneumococcal 23 valent vaccine  0.5 mL Intramuscular Tomorrow-1000  . traZODone  150 mg Oral QHS   Continuous Infusions: . sodium chloride    . insulin (NOVOLIN-R) infusion Stopped (01/16/17 0137)     LOS: 0 days    Time spent: 35 minutes. Greater than 50% of this time was spent in direct contact with the patient coordinating care.     Chaya Jan, MD [facility] Pager [phone]  If 7PM-7AM, please contact night-coverage [email or web address] Password [email or web address] 01/16/2017, 10:09 AM

## 2017-01-17 ENCOUNTER — Encounter (HOSPITAL_COMMUNITY): Payer: Self-pay

## 2017-01-17 ENCOUNTER — Other Ambulatory Visit: Payer: Self-pay

## 2017-01-17 LAB — CBC
HEMATOCRIT: 41.1 % (ref 36.0–46.0)
HEMOGLOBIN: 12.7 g/dL (ref 12.0–15.0)
MCH: 28.2 pg (ref 26.0–34.0)
MCHC: 30.9 g/dL (ref 30.0–36.0)
MCV: 91.3 fL (ref 78.0–100.0)
Platelets: 298 10*3/uL (ref 150–400)
RBC: 4.5 MIL/uL (ref 3.87–5.11)
RDW: 15.9 % — ABNORMAL HIGH (ref 11.5–15.5)
WBC: 9.8 10*3/uL (ref 4.0–10.5)

## 2017-01-17 LAB — BASIC METABOLIC PANEL
ANION GAP: 10 (ref 5–15)
BUN: <5 mg/dL — ABNORMAL LOW (ref 6–20)
CHLORIDE: 106 mmol/L (ref 101–111)
CO2: 21 mmol/L — ABNORMAL LOW (ref 22–32)
Calcium: 8.6 mg/dL — ABNORMAL LOW (ref 8.9–10.3)
Creatinine, Ser: 0.47 mg/dL (ref 0.44–1.00)
GFR calc Af Amer: >60 mL/min (ref >60)
GLUCOSE: 266 mg/dL — AB (ref 65–99)
POTASSIUM: 3.6 mmol/L (ref 3.5–5.1)
Sodium: 137 mmol/L (ref 135–145)

## 2017-01-17 LAB — GLUCOSE, CAPILLARY
GLUCOSE-CAPILLARY: 263 mg/dL — AB (ref 65–99)
GLUCOSE-CAPILLARY: 158 mg/dL — AB (ref 65–99)
Glucose-Capillary: 185 mg/dL — ABNORMAL HIGH (ref 65–99)

## 2017-01-17 LAB — GC/CHLAMYDIA PROBE AMP (~~LOC~~) NOT AT ARMC
CHLAMYDIA, DNA PROBE: NEGATIVE
Neisseria Gonorrhea: NEGATIVE

## 2017-01-17 LAB — HIV ANTIBODY (ROUTINE TESTING W REFLEX): HIV SCREEN 4TH GENERATION: NONREACTIVE

## 2017-01-17 MED ORDER — INSULIN ASPART 100 UNIT/ML ~~LOC~~ SOLN: 0.0000 [IU] | Freq: Three times a day (TID) | SUBCUTANEOUS | 11 refills | Status: DC

## 2017-01-17 MED ORDER — INSULIN STARTER KIT- PEN NEEDLES (ENGLISH)
1.0000 | Freq: Once | Status: DC
  Filled 2017-01-17: qty 1

## 2017-01-17 MED ORDER — CITALOPRAM HYDROBROMIDE 20 MG PO TABS: 20.0000 mg | ORAL_TABLET | Freq: Every day | ORAL | 2 refills | Status: DC

## 2017-01-17 MED ORDER — LIVING WELL WITH DIABETES BOOK
Freq: Once | Status: DC
  Filled 2017-01-17: qty 1

## 2017-01-17 MED ORDER — INSULIN GLARGINE 100 UNIT/ML ~~LOC~~ SOLN: 25.0000 [IU] | SUBCUTANEOUS | 11 refills | Status: AC

## 2017-01-17 NOTE — Progress Notes (Addendum)
Inpatient Diabetes Program Recommendations  AACE/ADA: New Consensus Statement on Inpatient Glycemic Control (2015)  Target Ranges:  Prepandial:   less than 140 mg/dL      Peak postprandial:   less than 180 mg/dL (1-2 hours)      Critically ill patients:  140 - 180 mg/dL  Results for Wendy Larsen, Wendy Larsen (MRN [id number]) as of 01/17/2017 07:33  Ref. Range 01/17/2017 04:06  Glucose Latest Ref Range: 65 - 99 mg/dL 266 (H)   Results for Wendy Larsen, Wendy Larsen (MRN [id number]) as of 01/17/2017 07:33  Ref. Range 01/16/2017 07:53 01/16/2017 11:20 01/16/2017 16:49 01/16/2017 21:18  Glucose-Capillary Latest Ref Range: 65 - 99 mg/dL 243 (H) 328 (H) 225 (H) 218 (H)  Results for Wendy Larsen, Wendy Larsen (MRN [id number]) as of 01/17/2017 07:33  Ref. Range 01/15/2017 22:11  Hemoglobin A1C Latest Ref Range: 4.8 - 5.6 % 16.5 (H)   Review of Glycemic Control  Diabetes history: DM2 (per H&P pt was dx with DM about 3 months ago) Outpatient Diabetes medications: Metformin (but pt had stopped taking) Current orders for Inpatient glycemic control: Lantus 25 units Q24H, Novolog 0-15 units TID with meals, Novolog 0-5 units QHS, Novolog 4 units TID with meals for meal coverage  Inpatient Diabetes Program Recommendations: Insulin - Basal: Please consider increasing Lantus to 30 units Q24H. Insulin - Meal Coverage: Please consider increasing meal coverage to Novolog 8 units TID with meals for meal coverage if patient eats at least 50% of meals. HgbA1C: A1C 16.5% on 01/15/17 indicating an average glucose of 427 mg/dl over the past 2-3 months.  NOTE: Per H&P, patient was "diagnosed with diabetes about 3 months ago and placed on metformin, however she quickly got disillusioned and states she was depressed because her sugars remained in the 200-300 range so she quit taking her metformin."  Initial glucose 677 mg/dl and A1C 16.5% on 01/15/17.  Living Well with DM book has already been ordered. Ordered: insulin starter kit (pens), dietitian  consult, DM education videos, and patient education by bedside nursing.   Bedside NURSING: Please use each patient interaction to provide diabetes education. Please review Living Well with Diabetes booklet and Insulin Starter kit with the patient, have patient watch patient education videos on diabetes, and instruct on insulin administration. Please allow patient to be actively engaged with diabetes management by allowing patient to check own glucose and self-administer insulin injections.    Addendum 01/17/17_0 :62- Spoke with Adonis Huguenin, RN and nursing has already been educating patient on DM and insulin.  Spoke with patient over phone (Diabetes Coordinator working from [employer]) about diabetes and home regimen for diabetes control. Patient reports that she is followed by PCP with [facility] for diabetes management. Patient was dx with DM about 3 months ago and was started on Metformin 500 mg once a day and increased to BID dosing.  Patient has a glucometer and testing supplies at home. Patient reports that she has a follow up appointment with PCP scheduled for 02/01/17.  Patient reports that she lives in the [place] area. Had noted on progress by RD today that patient was homeless. Inquired about homelessness. Patient reports that she lives with different people and she has a place to stay and to keep her insulin refrigerated.  Discussed A1C results (16.5% on 01/15/17) and explained that her current A1C indicates an average glucose of 427 mg/dl over the past 2-3 months. Discussed glucose and A1C goals. Discussed importance of checking CBGs and maintaining good CBG control to prevent long-term and  short-term complications. Explained how hyperglycemia leads to damage within blood vessels which lead to the common complications seen with uncontrolled diabetes. Stressed to the patient the importance of improving glycemic control to prevent further complications from uncontrolled diabetes. Discussed impact of  nutrition, exercise, stress, sickness, and medications on diabetes control. Discussed Lantus and Novolog insulin. Patient states that she prefers insulin pens and nursing has educated on insulin pens. Patient is able to verbalize proper steps of insulin pen use. Discussed hypoglycemia along with proper treatment and patient reports that she plans to get some glucose tablets from the pharmacy.  Encouraged patient to check her glucose 4 times per day (before meals and at bedtime) and to keep a log book of glucose readings and insulin taken which she will need to take to doctor appointments. Explained how the doctor she follows up with can use the log book to continue to make insulin adjustments if needed. Patient verbalized understanding of information discussed and she states that she has no further questions at this time related to diabetes.  Thanks, Barnie Alderman, RN, MSN, CDE Diabetes Coordinator Inpatient Diabetes Program [phone] (Team Pager from 8am to 5pm)

## 2017-01-17 NOTE — Progress Notes (Signed)
  RD consulted for nutrition education regarding diabetes.   Lab Results  Component Value Date   HGBA1C 16.5 (H) 01/15/2017    RD provided "Carbohydrate Counting and My plate" handout from the Academy of [employer]etics. Discussed different food groups and their effects on blood sugar, emphasizing carbohydrate-containing foods. Provided list of carbohydrates and recommended serving sizes of common foods.  Discussed importance of controlled and consistent carbohydrate intake throughout the day. Provided examples of ways to balance meals/snacks and encouraged intake of high-fiber, whole grain complex carbohydrates. Teach back method used.  Expect fair compliance since she is homeless.  Body mass index is 42.11 kg/m. Pt meets criteria for morbid obesity based on current BMI.  Current diet order is full liquids patient is consuming approximately <50% of meals at this time. Labs and medications reviewed. No further nutrition interventions warranted at this time. RD contact information provided. The patient is being discharged today per discussion with her nurse.   Royann ShiversLynn Clorine Swing MS,RD,CSG,LDN Office: [phone] Pager: [phone]

## 2017-01-17 NOTE — Progress Notes (Signed)
Discharged via wheelchair to care of spouse.  Assisted into private car.  All discharge instructions reviewed and given to patient. Given opportunity to ask questions.  All personal belongings taken with patient.

## 2017-01-17 NOTE — Discharge Summary (Signed)
Physician Discharge Summary  Wendy Larsen NWG:[id number] DOB: 02/13/1970 DOA: 01/15/2017  PCP: System, Pcp Not In  Admit date: 01/15/2017 Discharge date: 01/17/2017  Time spent: 45 minutes  Recommendations for Outpatient Follow-up:  -To be discharged home today, she is follow-up with her primary care in 1-2 weeks, she is a new insulin start.  -Also needs close outpatient follow-up with her psychiatrist for management of her schizophrenia.  Discharge Diagnoses:  Active Problems:   DKA (diabetic ketoacidoses) (HCC)   Intertrigo   Schizophrenia (HCC)   Bacterial vaginosis   Nausea & vomiting   Cough   Discharge Condition: Stable and improved  Filed Weights   01/15/17 2110 01/16/17 0500 01/17/17 0500  Weight: 97.2 kg (214 lb 4.6 oz) 97.2 kg (214 lb 4.6 oz) 101.1 kg (222 lb 14.2 oz)    History of present illness:  As per Dr. Kerry HoughMemon on 12/29: Wendy Larsen is a 46 y.o. female with medical history significant of diabetes, schizophrenia, comes to the hospital with a 2-3-week history of persistent cough, wheezing, nausea, vomiting.  She reports in the past few days her symptoms have gotten worse and she is been unable to keep anything down.  When she tries to eat or drink something, she sometimes feels as though she is choking.  She has not had any fever.  She has felt constipated recently, no diarrhea.  No dysuria.  She has not noticed a rash in her groin.  She has occasional abdominal cramps.  She is also noted polyuria, blurry vision for the past 2 weeks.  She reports recently being diagnosed with diabetes and started on metformin in the past several months.  Approximately 2 months ago, she noticed that her blood sugars are running in the 200-300 range and became very frustrated that despite taking metformin her blood sugars were uncontrolled.  She then stopped taking all her medications.  She reports that she has been hearing voices and seeing people that she knows are not there.  She  denies any suicidal or homicidal ideations.  She was hearing these voices despite taking her psychiatric meds and stopped taking them approximately 2 months ago.  She continues to hear these voices, but has not taken any medications in approximately 2 months.  She has been unable to get in with her psychiatrist and reports having an appointment in the next 2 weeks.  ED Course: Patient was noted to have elevated blood glucose over 600.  Bicarb of 17 and an anion gap of 18.  Patient was started on IV fluids and insulin infusion per DKA protocol.  Chest x-ray did not show any acute findings.  Urinalysis did not show any signs of infection.  She underwent pelvic exam that indicated clue cells on wet prep.  Remainder of workup was unrevealing.  Patient received full liquids in the emergency room and did not have any episodes of choking or evidence of aspiration.    Hospital Course:   DKA -Resolved. -We will discharge home on Lantus and sliding scale. -She was on metformin prior to admission.  Schizophrenia -She is not suicidal, mood appears stable.  Needs close follow-up with psychiatry.  Bacterial vaginosis -Was given 2000 mg of Flagyl.  Nausea and vomiting -Suspect related to DKA, resolved.  Procedures:  None   Consultations:  None  Discharge Instructions  Discharge Instructions    Ambulatory referral to Nutrition and Diabetic Education   Complete by:  As directed    Dx with DM about  3 months ago; was started on Metformin but patient stopped. Admitted with DKA; A1C 16.5% on 01/15/17 and will d/c new to insulin   Diet - low sodium heart healthy   Complete by:  As directed    Increase activity slowly   Complete by:  As directed      Allergies as of 01/17/2017      Reactions   Aspirin Nausea And Vomiting, Other (See Comments)   Upset stomach      Medication List    TAKE these medications   citalopram 20 MG tablet Commonly known as:  CELEXA Take 1 tablet (20 mg total)  by mouth daily.   insulin aspart 100 UNIT/ML injection Commonly known as:  novoLOG Inject 0-15 Units into the skin 3 (three) times daily with meals. CBG 70-120: 0 units CBG 121-150: 1 unit CBG 151-200: 2 units CBG 201-250: 3 units CBG 251-300: 5 units CBG 301-350: 7 units CBG greater than 351: 9 units   insulin glargine 100 UNIT/ML injection Commonly known as:  LANTUS Inject 0.25 mLs (25 Units total) into the skin daily.   traZODone 150 MG tablet Commonly known as:  DESYREL Take 150 mg by mouth at bedtime.      Allergies  Allergen Reactions  . Aspirin Nausea And Vomiting and Other (See Comments)    Upset stomach   Follow-up Information    regular physician. Schedule an appointment as soon as possible for a visit in 2 week(s).            The results of significant diagnostics from this hospitalization (including imaging, microbiology, ancillary and laboratory) are listed below for reference.    Significant Diagnostic Studies: Dg Chest 2 View  Result Date: 01/15/2017 CLINICAL DATA:  Vomiting and coughing for 2 weeks. EXAM: CHEST  2 VIEW COMPARISON:  April 15, 2009 FINDINGS: The heart size and mediastinal contours are within normal limits. There is no focal infiltrate, pulmonary edema, or pleural effusion. The visualized skeletal structures are unremarkable. IMPRESSION: No active cardiopulmonary disease. Electronically Signed   By: Sherian Rein M.D.   On: 01/15/2017 10:21   US Abdomen Limited Ruq  Result Date: 01/15/2017 CLINICAL DATA:  Right upper quadrant pain. EXAM: ULTRASOUND ABDOMEN LIMITED RIGHT UPPER QUADRANT COMPARISON:  None. FINDINGS: Gallbladder: No evidence for gallstones. Gallbladder wall thickness upper normal at 3 mm. 5 mm echogenic non mobile polyp identified. Sonographer reports no sonographic Murphy sign. Common bile duct: Diameter: 4 mm Liver: Coarsening of the hepatic echotexture suggests fatty deposition. Portal vein is patent on color Doppler imaging  with normal direction of blood flow towards the liver. IMPRESSION: No definite findings to explain the patient's history of right upper quadrant abdominal pain. Electronically Signed   By: Kennith Center M.D.   On: 01/15/2017 12:23    Microbiology: Recent Results (from the past 240 hour(s))  Wet prep, genital     Status: Abnormal   Collection Time: 01/15/17 11:15 AM  Result Value Ref Range Status   Yeast Wet Prep HPF POC NONE SEEN NONE SEEN Final   Trich, Wet Prep NONE SEEN NONE SEEN Final   Clue Cells Wet Prep HPF POC PRESENT (A) NONE SEEN Final   WBC, Wet Prep HPF POC MODERATE (A) NONE SEEN Final   Sperm PRESENT  Final  MRSA PCR Screening     Status: None   Collection Time: 01/15/17  9:20 PM  Result Value Ref Range Status   MRSA by PCR NEGATIVE NEGATIVE Final  Comment:        The GeneXpert MRSA Assay (FDA approved for NASAL specimens only), is one component of a comprehensive MRSA colonization surveillance program. It is not intended to diagnose MRSA infection nor to guide or monitor treatment for MRSA infections.      Labs: Basic Metabolic Panel: Recent Labs  Lab 01/15/17 2211 01/16/17 0420 01/16/17 0827 01/16/17 1402 01/17/17 0406  NA 137 136 138 136 137  K 3.3* 3.7 4.2 3.7 3.6  CL 105 106 107 106 106  CO2 22 18* 17* 20* 21*  GLUCOSE 177* 219* 253* 298* 266*  BUN 5* <5* <5* <5* <5*  CREATININE 0.49 0.50 0.53 0.54 0.47  CALCIUM 8.6* 8.6* 8.7* 8.6* 8.6*   Liver Function Tests: Recent Labs  Lab 01/15/17 0846 01/15/17 1357  AST 14* 13*  ALT 17 14  ALKPHOS 137* 121  BILITOT 1.6* 1.4*  PROT 7.0 6.6  ALBUMIN 3.6 3.4*   Recent Labs  Lab 01/15/17 0846  LIPASE 26   No results for input(s): AMMONIA in the last 168 hours. CBC: Recent Labs  Lab 01/15/17 0846 01/15/17 2211 01/17/17 0406  WBC 10.6* 10.2 9.8  HGB 13.7 13.4 12.7  HCT 43.1 41.9 41.1  MCV 89.6 89.5 91.3  PLT 291 302 298   Cardiac Enzymes: No results for input(s): CKTOTAL, CKMB,  CKMBINDEX, TROPONINI in the last 168 hours. BNP: BNP (last 3 results) No results for input(s): BNP in the last 8760 hours.  ProBNP (last 3 results) No results for input(s): PROBNP in the last 8760 hours.  CBG: Recent Labs  Lab 01/16/17 1120 01/16/17 1649 01/16/17 2118 01/17/17 0742 01/17/17 1142  GLUCAP 328* 225* 218* 263* 185*       Signed:  Chaya JanEstela Hernandez Acosta  [facility] Pager: [phone] 01/17/2017, 3:44 PM

## 2019-05-08 ENCOUNTER — Other Ambulatory Visit: Payer: Self-pay

## 2019-05-08 ENCOUNTER — Emergency Department (HOSPITAL_COMMUNITY): Payer: No Typology Code available for payment source

## 2019-05-08 ENCOUNTER — Emergency Department (HOSPITAL_COMMUNITY)
Admission: EM | Admit: 2019-05-08 | Discharge: 2019-05-09 | Disposition: A | Discharge status: Home / Self Care | Payer: No Typology Code available for payment source | Attending: Emergency Medicine | Admitting: Emergency Medicine

## 2019-05-08 DIAGNOSIS — S3992XA Unspecified injury of lower back, initial encounter: Secondary | ICD-10-CM | POA: Diagnosis present

## 2019-05-08 DIAGNOSIS — S161XXA Strain of muscle, fascia and tendon at neck level, initial encounter: Secondary | ICD-10-CM | POA: Diagnosis not present

## 2019-05-08 DIAGNOSIS — E119 Type 2 diabetes mellitus without complications: Secondary | ICD-10-CM | POA: Insufficient documentation

## 2019-05-08 DIAGNOSIS — Y999 Unspecified external cause status: Secondary | ICD-10-CM | POA: Diagnosis not present

## 2019-05-08 DIAGNOSIS — Z79899 Other long term (current) drug therapy: Secondary | ICD-10-CM | POA: Insufficient documentation

## 2019-05-08 DIAGNOSIS — F17210 Nicotine dependence, cigarettes, uncomplicated: Secondary | ICD-10-CM | POA: Diagnosis not present

## 2019-05-08 DIAGNOSIS — S39012A Strain of muscle, fascia and tendon of lower back, initial encounter: Secondary | ICD-10-CM | POA: Insufficient documentation

## 2019-05-08 DIAGNOSIS — Y93I9 Activity, other involving external motion: Secondary | ICD-10-CM | POA: Diagnosis not present

## 2019-05-08 DIAGNOSIS — Z794 Long term (current) use of insulin: Secondary | ICD-10-CM | POA: Insufficient documentation

## 2019-05-08 DIAGNOSIS — Y92410 Unspecified street and highway as the place of occurrence of the external cause: Secondary | ICD-10-CM | POA: Diagnosis not present

## 2019-05-08 MED ORDER — CYCLOBENZAPRINE HCL 5 MG PO TABS: 5.0000 mg | ORAL_TABLET | Freq: Two times a day (BID) | ORAL | 0 refills | Status: AC | PRN

## 2019-05-08 MED ORDER — HYDROCODONE-ACETAMINOPHEN 5-325 MG PO TABS: 1.0000 | ORAL_TABLET | Freq: Four times a day (QID) | ORAL | 0 refills | Status: AC | PRN
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
